# Patient Record
Sex: Male | Born: 1965 | Race: Black or African American | Hispanic: No | Marital: Married | Smoking: Never smoker
Health system: Southern US, Community
[De-identification: ages and names within clinical notes are randomized; demographics above are authoritative.]

## PROBLEM LIST (undated history)

## (undated) DIAGNOSIS — F32A Depression, unspecified: Secondary | ICD-10-CM

## (undated) DIAGNOSIS — E785 Hyperlipidemia, unspecified: Secondary | ICD-10-CM

## (undated) DIAGNOSIS — I639 Cerebral infarction, unspecified: Secondary | ICD-10-CM

## (undated) DIAGNOSIS — G4733 Obstructive sleep apnea (adult) (pediatric): Secondary | ICD-10-CM

## (undated) DIAGNOSIS — I10 Essential (primary) hypertension: Secondary | ICD-10-CM

## (undated) DIAGNOSIS — R519 Headache, unspecified: Secondary | ICD-10-CM

## (undated) DIAGNOSIS — Z8619 Personal history of other infectious and parasitic diseases: Secondary | ICD-10-CM

## (undated) DIAGNOSIS — G8929 Other chronic pain: Secondary | ICD-10-CM

## (undated) DIAGNOSIS — R51 Headache: Secondary | ICD-10-CM

## (undated) DIAGNOSIS — R7989 Other specified abnormal findings of blood chemistry: Secondary | ICD-10-CM

## (undated) DIAGNOSIS — F329 Major depressive disorder, single episode, unspecified: Secondary | ICD-10-CM

## (undated) DIAGNOSIS — J45909 Unspecified asthma, uncomplicated: Secondary | ICD-10-CM

## (undated) HISTORY — PX: ACHILLES TENDON REPAIR: SUR1153

## (undated) HISTORY — DX: Other chronic pain: G89.29

## (undated) HISTORY — DX: Obstructive sleep apnea (adult) (pediatric): G47.33

## (undated) HISTORY — DX: Major depressive disorder, single episode, unspecified: F32.9

## (undated) HISTORY — DX: Other specified abnormal findings of blood chemistry: R79.89

## (undated) HISTORY — DX: Headache: R51

## (undated) HISTORY — DX: Essential (primary) hypertension: I10

## (undated) HISTORY — DX: Cerebral infarction, unspecified: I63.9

## (undated) HISTORY — DX: Personal history of other infectious and parasitic diseases: Z86.19

## (undated) HISTORY — DX: Headache, unspecified: R51.9

## (undated) HISTORY — DX: Depression, unspecified: F32.A

## (undated) HISTORY — DX: Unspecified asthma, uncomplicated: J45.909

## (undated) HISTORY — DX: Hyperlipidemia, unspecified: E78.5

---

## 2010-08-13 DIAGNOSIS — I639 Cerebral infarction, unspecified: Secondary | ICD-10-CM

## 2010-08-13 HISTORY — DX: Cerebral infarction, unspecified: I63.9

## 2015-03-31 ENCOUNTER — Telehealth: Payer: Self-pay | Admitting: Behavioral Health

## 2015-03-31 NOTE — Telephone Encounter (Signed)
Unable to reach patient at time of Pre-Visit Call.  Left message for patient to return call when available.    

## 2015-04-04 ENCOUNTER — Ambulatory Visit (INDEPENDENT_AMBULATORY_CARE_PROVIDER_SITE_OTHER): Payer: BC Managed Care – PPO | Admitting: Physician Assistant

## 2015-04-04 ENCOUNTER — Encounter: Payer: Self-pay | Admitting: Physician Assistant

## 2015-04-04 VITALS — BP 136/96 | HR 62 | Temp 98.2°F | Resp 16 | Ht 72.75 in | Wt 263.1 lb

## 2015-04-04 DIAGNOSIS — F329 Major depressive disorder, single episode, unspecified: Secondary | ICD-10-CM

## 2015-04-04 DIAGNOSIS — R5382 Chronic fatigue, unspecified: Secondary | ICD-10-CM | POA: Diagnosis not present

## 2015-04-04 DIAGNOSIS — Z8673 Personal history of transient ischemic attack (TIA), and cerebral infarction without residual deficits: Secondary | ICD-10-CM

## 2015-04-04 DIAGNOSIS — R7989 Other specified abnormal findings of blood chemistry: Secondary | ICD-10-CM

## 2015-04-04 DIAGNOSIS — F32A Depression, unspecified: Secondary | ICD-10-CM | POA: Insufficient documentation

## 2015-04-04 DIAGNOSIS — E291 Testicular hypofunction: Secondary | ICD-10-CM

## 2015-04-04 DIAGNOSIS — I10 Essential (primary) hypertension: Secondary | ICD-10-CM

## 2015-04-04 LAB — CBC
HEMATOCRIT: 40.8 % (ref 39.0–52.0)
HEMOGLOBIN: 13.6 g/dL (ref 13.0–17.0)
MCHC: 33.3 g/dL (ref 30.0–36.0)
MCV: 80.9 fl (ref 78.0–100.0)
Platelets: 175 10*3/uL (ref 150.0–400.0)
RBC: 5.05 Mil/uL (ref 4.22–5.81)
RDW: 14.8 % (ref 11.5–15.5)
WBC: 7 10*3/uL (ref 4.0–10.5)

## 2015-04-04 LAB — BASIC METABOLIC PANEL
BUN: 16 mg/dL (ref 6–23)
CALCIUM: 9.6 mg/dL (ref 8.4–10.5)
CO2: 26 meq/L (ref 19–32)
CREATININE: 1.45 mg/dL (ref 0.40–1.50)
Chloride: 103 mEq/L (ref 96–112)
GFR: 66.29 mL/min (ref >60.00)
Glucose, Bld: 107 mg/dL — ABNORMAL HIGH (ref 70–99)
Potassium: 4 mEq/L (ref 3.5–5.1)
Sodium: 138 mEq/L (ref 135–145)

## 2015-04-04 LAB — VITAMIN B12: VITAMIN B 12: 535 pg/mL (ref 211–911)

## 2015-04-04 LAB — TSH: TSH: 1.83 u[IU]/mL (ref 0.35–4.50)

## 2015-04-04 MED ORDER — SERTRALINE HCL 50 MG PO TABS
50.0000 mg | ORAL_TABLET | Freq: Every day | ORAL | Status: DC
Start: 2015-04-04 — End: 2015-09-06

## 2015-04-04 MED ORDER — HYDROCHLOROTHIAZIDE 12.5 MG PO TABS: 12.5000 mg | ORAL_TABLET | Freq: Every day | ORAL | Status: DC

## 2015-04-04 NOTE — Assessment & Plan Note (Signed)
In patient with Hx CVA, Depression, Low T and Low Vitamin D. Will check CBC, BMP, TSH, Vitamin D and B12 levels. Patient endorses recent Testosterone level. Will obtain records. Will treat based on findings.

## 2015-04-04 NOTE — Progress Notes (Signed)
Patient presents to clinic today for annual exam.  Patient is fasting for labs.  Patient c/o significant fatigue over the past year with worsening depression, not completely alleviated with Wellbutrin SR 150 mg BID. Has history of low Vitamin D and low testosterone, not currently being treated. Denies SI/HI. Denies anxiety but notes labile mood.  Chronic Issues: Hypertension -- Currently on Lisinopril taking daily. Patient denies chest pain, palpitations, lightheadedness, dizziness, vision changes or frequent headaches. Notes fatigue with medication. Also wanting to switch medications to a friend dying from angioedema 2/2 ACEI therapy. Has hx of CVA but no history of DM, MI or HF.  Hyperlipidemia -- Currently taking Pravachol 40 mg daily without myalgias.   Hx of CVA -- 2012. Is taking BP and cholesterol medications daily. Is taking 325 mg ASA daily. Is not followed by Neurology.  Health Maintenance: Dental -- up-to-date Vision -- up-to-date Immunizations -- up-to-date  Past Medical History  Diagnosis Date  . Stroke 2012  . History of chicken pox     6 Mths old  . Hypertension   . Hyperlipidemia   . Low testosterone   . Asthma     Past Surgical History  Procedure Laterality Date  . Achilles tendon repair      Left Leg    No current outpatient prescriptions on file prior to visit.   No current facility-administered medications on file prior to visit.    No Known Allergies  Family History  Problem Relation Age of Onset  . Hypertension Father   . Stroke Father     Late 18s  . Diabetes Father   . Sinusitis Mother   . Kidney disease Maternal Aunt   . Hypertension Maternal Aunt   . Liver cancer Maternal Aunt   . Hypertension Paternal Grandmother   . Hypertension Maternal Grandfather   . Alzheimer's disease Maternal Grandmother   . Aneurysm Paternal Grandmother   . Heart disease Maternal Grandmother   . Hypertension Paternal Grandfather   . Stroke Paternal  Grandfather   . Healthy Sister   . Healthy Daughter   . Healthy Son     Social History   Social History  . Marital Status: Married    Spouse Name: N/A  . Number of Children: N/A  . Years of Education: N/A   Occupational History  . Not on file.   Social History Main Topics  . Smoking status: Never Smoker   . Smokeless tobacco: Never Used  . Alcohol Use: No     Comment: Former Use  . Drug Use: No  . Sexual Activity: Not Currently   Other Topics Concern  . Not on file   Social History Narrative   Review of Systems  Constitutional: Positive for malaise/fatigue. Negative for fever and weight loss.  Eyes: Negative for blurred vision.  Cardiovascular: Negative for chest pain and palpitations.  Neurological: Negative for dizziness, tingling, tremors, sensory change, loss of consciousness and headaches.  Psychiatric/Behavioral: Positive for depression. Negative for suicidal ideas, hallucinations and substance abuse. The patient is not nervous/anxious and does not have insomnia.     BP 136/96 mmHg  Pulse 62  Temp(Src) 98.2 F (36.8 C) (Oral)  Resp 16  Ht 6' 0.75" (1.848 m)  Wt 263 lb 2 oz (119.353 kg)  BMI 34.95 kg/m2  SpO2 99%  Physical Exam  Constitutional: He is oriented to person, place, and time and well-developed, well-nourished, and in no distress.  HENT:  Head: Normocephalic and atraumatic.  Eyes: Conjunctivae  are normal.  Neck: Neck supple.  Cardiovascular: Normal rate, regular rhythm, normal heart sounds and intact distal pulses.   Pulmonary/Chest: Effort normal and breath sounds normal. No respiratory distress. He has no wheezes. He has no rales. He exhibits no tenderness.  Neurological: He is alert and oriented to person, place, and time.  Skin: Skin is warm and dry. No rash noted.  Psychiatric: He exhibits a depressed mood.  Vitals reviewed.   No results found for this or any previous visit (from the past 2160 hour(s)).  Assessment/Plan: Chronic  fatigue In patient with Hx CVA, Depression, Low T and Low Vitamin D. Will check CBC, BMP, TSH, Vitamin D and B12 levels. Patient endorses recent Testosterone level. Will obtain records. Will treat based on findings.  Depression PHQ 15 currently on treatment with Wellbutrin SR 150 mg BID. Will add-on Sertraline 50 mg daily. Counseling services discussed. Handout given to schedule an appointment. Side effects and alarm symptoms discussed. Follow-up 1 month.  Essential hypertension, benign Will stop lisinopril and begin HCTZ 12.5 mg daily. DASH diet discussed. Weight loss encouraged. Continue ASA 325 mg daily. Follow-up 1 month.  Low serum vitamin D Will check Vitamin D level.  Low testosterone Will obtain records so testosterone levels can be assessed.

## 2015-04-04 NOTE — Progress Notes (Signed)
Pre visit review using our clinic review tool, if applicable. No additional management support is needed unless otherwise documented below in the visit note/SLS  

## 2015-04-04 NOTE — Patient Instructions (Signed)
Please start the Zoloft taking daily as directed. Continue the Wellbutrin.  For blood pressure, stop the lisinopril and begin the HCTZ daily. Watch your salt intake!  I will call you with your lab results.  Follow-up 1 month  DASH Eating Plan DASH stands for "Dietary Approaches to Stop Hypertension." The DASH eating plan is a healthy eating plan that has been shown to reduce high blood pressure (hypertension). Additional health benefits may include reducing the risk of type 2 diabetes mellitus, heart disease, and stroke. The DASH eating plan may also help with weight loss. WHAT DO I NEED TO KNOW ABOUT THE DASH EATING PLAN? For the DASH eating plan, you will follow these general guidelines:  Choose foods with a percent daily value for sodium of less than 5% (as listed on the food label).  Use salt-free seasonings or herbs instead of table salt or sea salt.  Check with your health care provider or pharmacist before using salt substitutes.  Eat lower-sodium products, often labeled as "lower sodium" or "no salt added."  Eat fresh foods.  Eat more vegetables, fruits, and low-fat dairy products.  Choose whole grains. Look for the word "whole" as the first word in the ingredient list.  Choose fish and skinless chicken or [place] more often than red meat. Limit fish, poultry, and meat to 6 oz (170 g) each day.  Limit sweets, desserts, sugars, and sugary drinks.  Choose heart-healthy fats.  Limit cheese to 1 oz (28 g) per day.  Eat more home-cooked food and less restaurant, buffet, and fast food.  Limit fried foods.  Cook foods using methods other than frying.  Limit canned vegetables. If you do use them, rinse them well to decrease the sodium.  When eating at a restaurant, ask that your food be prepared with less salt, or no salt if possible. WHAT FOODS CAN I EAT? Seek help from a dietitian for individual calorie needs. Grains Whole grain or whole wheat bread. Brown rice.  Whole grain or whole wheat pasta. Quinoa, bulgur, and whole grain cereals. Low-sodium cereals. Corn or whole wheat flour tortillas. Whole grain cornbread. Whole grain crackers. Low-sodium crackers. Vegetables Fresh or frozen vegetables (raw, steamed, roasted, or grilled). Low-sodium or reduced-sodium tomato and vegetable juices. Low-sodium or reduced-sodium tomato sauce and paste. Low-sodium or reduced-sodium canned vegetables.  Fruits All fresh, canned (in natural juice), or frozen fruits. Meat and Other Protein Products Ground beef (85% or leaner), grass-fed beef, or beef trimmed of fat. Skinless chicken or [place]. Ground chicken or [place]. Pork trimmed of fat. All fish and seafood. Eggs. Dried beans, peas, or lentils. Unsalted nuts and seeds. Unsalted canned beans. Dairy Low-fat dairy products, such as skim or 1% milk, 2% or reduced-fat cheeses, low-fat ricotta or cottage cheese, or plain low-fat yogurt. Low-sodium or reduced-sodium cheeses. Fats and Oils Tub margarines without trans fats. Light or reduced-fat mayonnaise and salad dressings (reduced sodium). Avocado. Safflower, olive, or canola oils. Natural peanut or almond butter. Other Unsalted popcorn and pretzels. The items listed above may not be a complete list of recommended foods or beverages. Contact your dietitian for more options. WHAT FOODS ARE NOT RECOMMENDED? Grains White bread. White pasta. White rice. Refined cornbread. Bagels and croissants. Crackers that contain trans fat. Vegetables Creamed or fried vegetables. Vegetables in a cheese sauce. Regular canned vegetables. Regular canned tomato sauce and paste. Regular tomato and vegetable juices. Fruits Dried fruits. Canned fruit in light or heavy syrup. Fruit juice. Meat and Other Protein Products Fatty cuts  of meat. Ribs, chicken wings, bacon, sausage, bologna, salami, chitterlings, fatback, hot dogs, bratwurst, and packaged luncheon meats. Salted nuts and seeds. Canned  beans with salt. Dairy Whole or 2% milk, cream, half-and-half, and cream cheese. Whole-fat or sweetened yogurt. Full-fat cheeses or blue cheese. Nondairy creamers and whipped toppings. Processed cheese, cheese spreads, or cheese curds. Condiments Onion and garlic salt, seasoned salt, table salt, and sea salt. Canned and packaged gravies. Worcestershire sauce. Tartar sauce. Barbecue sauce. Teriyaki sauce. Soy sauce, including reduced sodium. Steak sauce. Fish sauce. Oyster sauce. Cocktail sauce. Horseradish. Ketchup and mustard. Meat flavorings and tenderizers. Bouillon cubes. Hot sauce. Tabasco sauce. Marinades. Taco seasonings. Relishes. Fats and Oils Butter, stick margarine, lard, shortening, ghee, and bacon fat. Coconut, palm kernel, or palm oils. Regular salad dressings. Other Pickles and olives. Salted popcorn and pretzels. The items listed above may not be a complete list of foods and beverages to avoid. Contact your dietitian for more information. WHERE CAN I FIND MORE INFORMATION? National Heart, Lung, and Blood Institute: [employer] Document Released: 07/19/2011 Document Revised: 12/14/2013 Document Reviewed: 06/03/2013 [facility] Patient Information 2015 [place], [place]. This information is not intended to replace advice given to you by your health care provider. Make sure you discuss any questions you have with your health care provider.

## 2015-04-04 NOTE — Assessment & Plan Note (Signed)
Will check Vitamin D level

## 2015-04-04 NOTE — Assessment & Plan Note (Signed)
PHQ 15 currently on treatment with Wellbutrin SR 150 mg BID. Will add-on Sertraline 50 mg daily. Counseling services discussed. Handout given to schedule an appointment. Side effects and alarm symptoms discussed. Follow-up 1 month.

## 2015-04-04 NOTE — Assessment & Plan Note (Signed)
Will obtain records so testosterone levels can be assessed.

## 2015-04-04 NOTE — Assessment & Plan Note (Signed)
Will stop lisinopril and begin HCTZ 12.5 mg daily. DASH diet discussed. Weight loss encouraged. Continue ASA 325 mg daily. Follow-up 1 month.

## 2015-04-07 LAB — VITAMIN D 1,25 DIHYDROXY
Vitamin D 1, 25 (OH)2 Total: 60 pg/mL (ref 18–72)
Vitamin D2 1, 25 (OH)2: 19 pg/mL
Vitamin D3 1, 25 (OH)2: 41 pg/mL

## 2015-04-08 ENCOUNTER — Other Ambulatory Visit: Payer: Self-pay | Admitting: Physician Assistant

## 2015-04-08 ENCOUNTER — Telehealth: Payer: Self-pay | Admitting: Physician Assistant

## 2015-04-08 NOTE — Telephone Encounter (Signed)
Pt returned your call. Please call back.    CB: [phone]

## 2015-04-08 NOTE — Telephone Encounter (Signed)
Please call to assess this further.  

## 2015-04-08 NOTE — Telephone Encounter (Signed)
Relation to pt: self Call back number: [phone]  Pharmacy:  Reason for call:  Patient would like to discuss his heart rate due to patient considering doing a work out program.

## 2015-04-08 NOTE — Telephone Encounter (Signed)
LMOM with contact name and number for return call RE: needing further information [what type of program; what is heart rate and if there are any accompanying symptoms: dizziness, h/a, chest pain, SOB] per provider instructions/SLS

## 2015-04-11 NOTE — Telephone Encounter (Signed)
Patient reports he is startoing work out program at [employer] and had a episode of feeling "a little dizzy once" and they are requiring to know which medications make patient dizzy [he states he has this information] and thay are requiring to know by provider what is the maximum heart rate allowed during exercise/SLS Please Advise.

## 2015-04-11 NOTE — Telephone Encounter (Signed)
Hydrochlorothiazide may make him dizzy if BP getting low. We just started medication Recommend he stay well hydrated. I would recommend he come in for follow-up of BP on new med and EKG before deciding on a strenuous exercise regimen. I cannot give a max heart rate without making sure baseline EKG is stable

## 2015-04-12 NOTE — Telephone Encounter (Signed)
LMOM with contact name and number [Ok by patient [phone]] for return call RE: schedule appointment for BP check & EKG prior to starting strenuous exercise regimenand per further provider instructions/SLS

## 2015-04-13 ENCOUNTER — Ambulatory Visit (INDEPENDENT_AMBULATORY_CARE_PROVIDER_SITE_OTHER): Payer: BC Managed Care – PPO | Admitting: Psychology

## 2015-04-13 DIAGNOSIS — F4323 Adjustment disorder with mixed anxiety and depressed mood: Secondary | ICD-10-CM

## 2015-04-19 NOTE — Telephone Encounter (Signed)
Pt returning your call. Tried calling you to confirm appt type and pt hung up. Please let him know if he needs nurse visit or appt with [facility].

## 2015-04-19 NOTE — Telephone Encounter (Signed)
Patient scheduled follow up with [facility] for 05/02/2015. Is this ok patient states he wants EKG and BP done all at once.

## 2015-05-02 ENCOUNTER — Ambulatory Visit (INDEPENDENT_AMBULATORY_CARE_PROVIDER_SITE_OTHER): Payer: BC Managed Care – PPO | Admitting: Physician Assistant

## 2015-05-02 ENCOUNTER — Encounter: Payer: Self-pay | Admitting: Physician Assistant

## 2015-05-02 VITALS — BP 128/84 | HR 72 | Temp 98.1°F | Resp 16 | Ht 73.0 in | Wt 256.1 lb

## 2015-05-02 DIAGNOSIS — I10 Essential (primary) hypertension: Secondary | ICD-10-CM | POA: Diagnosis not present

## 2015-05-02 DIAGNOSIS — F329 Major depressive disorder, single episode, unspecified: Secondary | ICD-10-CM | POA: Diagnosis not present

## 2015-05-02 DIAGNOSIS — F32A Depression, unspecified: Secondary | ICD-10-CM

## 2015-05-02 MED ORDER — BUPROPION HCL ER (SR) 150 MG PO TB12: 150.0000 mg | ORAL_TABLET | Freq: Two times a day (BID) | ORAL | Status: DC

## 2015-05-02 NOTE — Progress Notes (Signed)
Patient presents to clinic today for follow-up of hypertension after starting HCTZ 12.5 mg daily. Endorses taking medication daily as directed. Patient denies chest pain, palpitations, lightheadedness, dizziness, vision changes or frequent headaches.  BP Readings from Last 3 Encounters:  05/02/15 128/84  04/04/15 136/96   Patient also endorses that depressed mood persistent despite increase in Zoloft. Patient reported Wellbutrin 150 mg BID at last visit but states he stopped medication by mistake. Denies withdrawal symptoms from medication. Denies SI/HI.  Past Medical History  Diagnosis Date  . Stroke 2012  . History of chicken pox     66 Mths old  . Hypertension   . Hyperlipidemia   . Low testosterone   . Asthma     Current Outpatient Prescriptions on File Prior to Visit  Medication Sig Dispense Refill  . aspirin 325 MG EC tablet Take 325 mg by mouth daily.    . fexofenadine (ALLEGRA) 180 MG tablet Take 180 mg by mouth daily as needed for allergies or rhinitis.    . hydrochlorothiazide (HYDRODIURIL) 12.5 MG tablet Take 1 tablet (12.5 mg total) by mouth daily. 30 tablet 3  . pravastatin (PRAVACHOL) 40 MG tablet TAKE 1 TABLET BY MOUTH DAILY 30 tablet 5  . sertraline (ZOLOFT) 50 MG tablet Take 1 tablet (50 mg total) by mouth daily. 30 tablet 3   No current facility-administered medications on file prior to visit.    No Known Allergies  Family History  Problem Relation Age of Onset  . Hypertension Father   . Stroke Father     Late 3s  . Diabetes Father   . Sinusitis Mother   . Kidney disease Maternal Aunt   . Hypertension Maternal Aunt   . Liver cancer Maternal Aunt   . Hypertension Paternal Grandmother   . Hypertension Maternal Grandfather   . Alzheimer's disease Maternal Grandmother   . Aneurysm Paternal Grandmother   . Heart disease Maternal Grandmother   . Hypertension Paternal Grandfather   . Stroke Paternal Grandfather   . Healthy Sister   . Healthy Daughter    . Healthy Son     Social History   Social History  . Marital Status: Married    Spouse Name: N/A  . Number of Children: N/A  . Years of Education: N/A   Social History Main Topics  . Smoking status: Never Smoker   . Smokeless tobacco: Never Used  . Alcohol Use: No     Comment: Former Use  . Drug Use: No  . Sexual Activity: Not Currently   Other Topics Concern  . None   Social History Narrative   Review of Systems - See HPI.  All other ROS are negative.  BP 128/84 mmHg  Pulse 72  Temp(Src) 98.1 F (36.7 C) (Oral)  Resp 16  Ht  (1.854 m)  Wt 256 lb 2 oz (116.178 kg)  BMI 33.80 kg/m2  SpO2 98%  Physical Exam  Constitutional: He is oriented to person, place, and time and well-developed, well-nourished, and in no distress.  HENT:  Head: Normocephalic and atraumatic.  Right Ear: External ear normal.  Left Ear: External ear normal.  Nose: Nose normal.  Mouth/Throat: Oropharynx is clear and moist. No oropharyngeal exudate.  Eyes: EOM are normal.  Cardiovascular: Normal rate, regular rhythm, normal heart sounds and intact distal pulses.   Pulmonary/Chest: Effort normal and breath sounds normal. No respiratory distress. He has no wheezes. He has no rales. He exhibits no tenderness.  Abdominal: Soft. Bowel sounds  are normal. He exhibits no distension and no mass. There is no tenderness. There is no rebound and no guarding.  Genitourinary: Testes/scrotum normal and penis normal. No discharge found.  Neurological: He is alert and oriented to person, place, and time.  Skin: Skin is warm and dry. No rash noted.  Psychiatric: Affect normal.  Vitals reviewed.  Recent Results (from the past 2160 hour(s))  CBC     Status: None   Collection Time: 04/04/15  9:18 AM  Result Value Ref Range   WBC 7.0 4.0 - 10.5 K/uL   RBC 5.05 4.22 - 5.81 Mil/uL   Platelets 175.0 150.0 - 400.0 K/uL   Hemoglobin 13.6 13.0 - 17.0 g/dL   HCT 16.1 09.6 - 04.5 %   MCV 80.9 78.0 - 100.0 fl    MCHC 33.3 30.0 - 36.0 g/dL   RDW 40.9 81.1 - 91.4 %  Basic Metabolic Panel (BMET)     Status: Abnormal   Collection Time: 04/04/15  9:18 AM  Result Value Ref Range   Sodium 138 135 - 145 mEq/L   Potassium 4.0 3.5 - 5.1 mEq/L   Chloride 103 96 - 112 mEq/L   CO2 26 19 - 32 mEq/L   Glucose, Bld 107 (H) 70 - 99 mg/dL   BUN 16 6 - 23 mg/dL   Creatinine, Ser 7.82 0.40 - 1.50 mg/dL   Calcium 9.6 8.4 - 95.6 mg/dL   GFR 21.30 >86.57 mL/min  Vitamin D 1,25 dihydroxy     Status: None   Collection Time: 04/04/15  9:18 AM  Result Value Ref Range   Vitamin D 1, 25 (OH)2 Total 60 18 - 72 pg/mL   Vitamin D3 1, 25 (OH)2 41 pg/mL   Vitamin D2 1, 25 (OH)2 19 pg/mL    Comment: Vitamin D3, 1,25(OH)2 indicates both endogenous production and supplementation.  Vitamin D2, 1,25(OH)2 is an indicator of exogeous sources, such as diet or supplementation.  Interpretation and therapy are based on measurement of Vitamin D,1,25(OH)2, Total. This test was developed and its analytical performance characteristics have been determined by [facility], [place], [place]. It has not been cleared or approved by the FDA. This assay has been validated pursuant to the CLIA regulations and is used for clinical purposes.   TSH     Status: None   Collection Time: 04/04/15  9:18 AM  Result Value Ref Range   TSH 1.83 0.35 - 4.50 uIU/mL  B12     Status: None   Collection Time: 04/04/15  9:18 AM  Result Value Ref Range   Vitamin B-12 535 211 - 911 pg/mL    Assessment/Plan: Essential hypertension EKG with NSR. Exercise regimen discussed. BP improved. Continue current regimen. Follow-up 3 months.  Depression Continue Sertraline. Resume Wellbutrin SR BID as directed. Follow-up in 3 months if symptoms have improved. Return sooner as needed.

## 2015-05-02 NOTE — Assessment & Plan Note (Signed)
EKG with NSR. Exercise regimen discussed. BP improved. Continue current regimen. Follow-up 3 months.

## 2015-05-02 NOTE — Assessment & Plan Note (Signed)
Continue Sertraline. Resume Wellbutrin SR BID as directed. Follow-up in 3 months if symptoms have improved. Return sooner as needed.

## 2015-05-02 NOTE — Patient Instructions (Signed)
Please continue your BP medications. Gradually increase your aerobic exercise to a goal of 150 minutes per week. Your target HR is 185 when you are working out on the treadmill. Remember to stay well hydrated.  Please continue the Zoloft but restart your Wellbutrin as directed. Start a B Complex vitamin daily.  Follow-up 3 months.

## 2015-05-02 NOTE — Progress Notes (Signed)
Pre visit review using our clinic review tool, if applicable. No additional management support is needed unless otherwise documented below in the visit note/SLS  

## 2015-05-04 ENCOUNTER — Ambulatory Visit (INDEPENDENT_AMBULATORY_CARE_PROVIDER_SITE_OTHER): Payer: BC Managed Care – PPO | Admitting: Psychology

## 2015-05-04 DIAGNOSIS — F4323 Adjustment disorder with mixed anxiety and depressed mood: Secondary | ICD-10-CM

## 2015-06-01 ENCOUNTER — Ambulatory Visit: Payer: BC Managed Care – PPO | Admitting: Psychology

## 2015-09-06 ENCOUNTER — Other Ambulatory Visit: Payer: Self-pay | Admitting: Physician Assistant

## 2015-09-06 DIAGNOSIS — I10 Essential (primary) hypertension: Secondary | ICD-10-CM

## 2015-09-06 DIAGNOSIS — F32A Depression, unspecified: Secondary | ICD-10-CM

## 2015-09-06 DIAGNOSIS — F329 Major depressive disorder, single episode, unspecified: Secondary | ICD-10-CM

## 2015-09-06 MED ORDER — PRAVASTATIN SODIUM 40 MG PO TABS: 40.0000 mg | ORAL_TABLET | Freq: Every day | ORAL | Status: DC

## 2015-09-06 MED ORDER — SERTRALINE HCL 50 MG PO TABS: 50.0000 mg | ORAL_TABLET | Freq: Every day | ORAL | Status: DC

## 2015-09-06 MED ORDER — BUPROPION HCL ER (SR) 150 MG PO TB12: 150.0000 mg | ORAL_TABLET | Freq: Two times a day (BID) | ORAL | Status: DC

## 2015-09-06 MED ORDER — HYDROCHLOROTHIAZIDE 12.5 MG PO TABS
12.5000 mg | ORAL_TABLET | Freq: Every day | ORAL | Status: DC
Start: 2015-09-06 — End: 2015-11-16

## 2015-09-06 NOTE — Telephone Encounter (Signed)
Caller name: Self  Can be reached: [phone] Pharmacy:   [email or web address] [place], [place] - [phone] [phone] (Phone) [phone] (Fax)        Reason for call: Refill on sertraline (ZOLOFT) 50 MG tablet [[id number]] and pravastatin (PRAVACHOL) 40 MG tablet [[id number]] and hydrochlorothiazide (HYDRODIURIL) 12.5 MG tablet [[id number] and buPROPion (WELLBUTRIN SR) 150 MG 12 hr tablet [[id number]]

## 2015-09-06 NOTE — Telephone Encounter (Signed)
Refills sent

## 2015-11-16 ENCOUNTER — Encounter: Payer: Self-pay | Admitting: Physician Assistant

## 2015-11-16 ENCOUNTER — Ambulatory Visit (INDEPENDENT_AMBULATORY_CARE_PROVIDER_SITE_OTHER): Payer: Medicaid Other | Admitting: Physician Assistant

## 2015-11-16 VITALS — BP 170/100 | HR 68 | Temp 98.5°F | Ht 73.0 in | Wt 255.2 lb

## 2015-11-16 DIAGNOSIS — I10 Essential (primary) hypertension: Secondary | ICD-10-CM

## 2015-11-16 MED ORDER — HYDROCHLOROTHIAZIDE 25 MG PO TABS: 25.0000 mg | ORAL_TABLET | Freq: Every day | ORAL | Status: DC

## 2015-11-16 NOTE — Assessment & Plan Note (Signed)
Repeat BP still elevated. Asymptomatic. Discussed importance of DASH diet. Will increase HCTZ to 25 mg daily. He is changing PCP due to Medicaid. He is to follow-up with new provider in 2-4 weeks.

## 2015-11-16 NOTE — Patient Instructions (Signed)
Please start the new dose of Hydrochlorothiazide daily.  Increase fluids.  Follow-up with your new provider in a few weeks.  If insurance changes, I am more than happy to see you again!

## 2015-11-16 NOTE — Progress Notes (Signed)
Patient presents to clinic today for follow-up of hypertension.  Patient endorses taking hydrchlorothiazide 12.5 mg daily as directed. Patient denies chest pain, palpitations, lightheadedness, dizziness, vision changes or frequent headaches.  BP Readings from Last 3 Encounters:  11/16/15 170/100  05/02/15 128/84  04/04/15 136/96   Past Medical History  Diagnosis Date  . Stroke (HCC) 2012  . History of chicken pox     5517 Mths old  . Hypertension   . Hyperlipidemia   . Low testosterone   . Asthma     Current Outpatient Prescriptions on File Prior to Visit  Medication Sig Dispense Refill  . aspirin 325 MG EC tablet Take 325 mg by mouth daily.    Marland Kitchen. buPROPion (WELLBUTRIN SR) 150 MG 12 hr tablet Take 1 tablet (150 mg total) by mouth 2 (two) times daily. 180 tablet 1  . hydrochlorothiazide (HYDRODIURIL) 12.5 MG tablet Take 1 tablet (12.5 mg total) by mouth daily. 90 tablet 1  . pravastatin (PRAVACHOL) 40 MG tablet Take 1 tablet (40 mg total) by mouth daily. 90 tablet 0  . sertraline (ZOLOFT) 50 MG tablet Take 1 tablet (50 mg total) by mouth daily. 90 tablet 1  . fexofenadine (ALLEGRA) 180 MG tablet Take 180 mg by mouth daily as needed for allergies or rhinitis. Reported on 11/16/2015     No current facility-administered medications on file prior to visit.    No Known Allergies  Family History  Problem Relation Age of Onset  . Hypertension Father   . Stroke Father     Late 7750s  . Diabetes Father   . Sinusitis Mother   . Kidney disease Maternal Aunt   . Hypertension Maternal Aunt   . Liver cancer Maternal Aunt   . Hypertension Paternal Grandmother   . Hypertension Maternal Grandfather   . Alzheimer's disease Maternal Grandmother   . Aneurysm Paternal Grandmother   . Heart disease Maternal Grandmother   . Hypertension Paternal Grandfather   . Stroke Paternal Grandfather   . Healthy Sister   . Healthy Daughter   . Healthy Son     Social History   Social History  .  Marital Status: Married    Spouse Name: N/A  . Number of Children: N/A  . Years of Education: N/A   Social History Main Topics  . Smoking status: Never Smoker   . Smokeless tobacco: Never Used  . Alcohol Use: No     Comment: Former Use  . Drug Use: No  . Sexual Activity: Not Currently   Other Topics Concern  . None   Social History Narrative   Review of Systems - See HPI.  All other ROS are negative.  BP 176/100 mmHg  Pulse 79  Temp(Src) 98.5 F (36.9 C) (Oral)  Ht 6\' 1"  (1.854 m)  Wt 255 lb 3.2 oz (115.758 kg)  BMI 33.68 kg/m2  SpO2 98%  Physical Exam  Constitutional: He is oriented to person, place, and time and well-developed, well-nourished, and in no distress.  HENT:  Head: Normocephalic and atraumatic.  Eyes: Conjunctivae are normal.  Cardiovascular: Normal rate, regular rhythm, normal heart sounds and intact distal pulses.   Pulmonary/Chest: Effort normal and breath sounds normal. No respiratory distress. He has no wheezes. He has no rales. He exhibits no tenderness.  Neurological: He is alert and oriented to person, place, and time.  Skin: Skin is warm and dry. No rash noted.  Psychiatric: Affect normal.  Vitals reviewed.    Assessment/Plan: No problem-specific assessment &  plan notes found for this encounter.

## 2015-11-16 NOTE — Progress Notes (Signed)
Pre visit review using our clinic review tool, if applicable. No additional management support is needed unless otherwise documented below in the visit note. 

## 2016-01-05 ENCOUNTER — Other Ambulatory Visit: Payer: Self-pay | Admitting: Physician Assistant

## 2016-01-16 ENCOUNTER — Telehealth: Payer: Self-pay | Admitting: Physician Assistant

## 2016-01-16 MED ORDER — PRAVASTATIN SODIUM 40 MG PO TABS: 40.0000 mg | ORAL_TABLET | Freq: Every day | ORAL | Status: DC

## 2016-01-16 NOTE — Telephone Encounter (Signed)
Rx sent to the pharmacy by e-script.  Pt due complete physical and fasting labs.//AB/CMA

## 2016-01-16 NOTE — Telephone Encounter (Signed)
Caller name: Self  Can be reached: [phone]   Pharmacy:  [id number] - [place], [place] - [address] [facility] [address] [phone] (Phone) [phone] (Fax)         Reason for call: refill pravastatin (PRAVACHOL) 40 MG tablet

## 2016-01-18 ENCOUNTER — Telehealth: Payer: Self-pay | Admitting: Physician Assistant

## 2016-01-18 ENCOUNTER — Ambulatory Visit: Payer: Medicaid Other | Admitting: Physician Assistant

## 2016-01-18 NOTE — Telephone Encounter (Signed)
VM from pt 6/7 9:35 am to cancel appt for today 3:30pm, called pt home # and he said he doesn't have a ride, pt scheduled for cpe 6/9, charge or no charge?

## 2016-01-18 NOTE — Telephone Encounter (Signed)
No charge for 1st no-show 

## 2016-01-18 NOTE — Telephone Encounter (Signed)
Scheduled pt for CPE  01/20/16

## 2016-01-19 ENCOUNTER — Encounter: Payer: Self-pay | Admitting: Physician Assistant

## 2016-01-19 NOTE — Telephone Encounter (Signed)
Waiving fee, mailing reminder letter °

## 2016-01-20 ENCOUNTER — Encounter: Payer: Self-pay | Admitting: Physician Assistant

## 2016-01-20 ENCOUNTER — Ambulatory Visit (INDEPENDENT_AMBULATORY_CARE_PROVIDER_SITE_OTHER): Payer: PRIVATE HEALTH INSURANCE | Admitting: Physician Assistant

## 2016-01-20 ENCOUNTER — Ambulatory Visit: Payer: Self-pay | Admitting: Physician Assistant

## 2016-01-20 VITALS — BP 167/109 | HR 69 | Temp 98.2°F | Resp 16 | Ht 73.0 in | Wt 256.2 lb

## 2016-01-20 DIAGNOSIS — I10 Essential (primary) hypertension: Secondary | ICD-10-CM | POA: Diagnosis not present

## 2016-01-20 DIAGNOSIS — G4710 Hypersomnia, unspecified: Secondary | ICD-10-CM

## 2016-01-20 DIAGNOSIS — R400 Somnolence: Secondary | ICD-10-CM | POA: Insufficient documentation

## 2016-01-20 DIAGNOSIS — Z0001 Encounter for general adult medical examination with abnormal findings: Secondary | ICD-10-CM | POA: Diagnosis not present

## 2016-01-20 DIAGNOSIS — R7989 Other specified abnormal findings of blood chemistry: Secondary | ICD-10-CM

## 2016-01-20 DIAGNOSIS — Z125 Encounter for screening for malignant neoplasm of prostate: Secondary | ICD-10-CM | POA: Insufficient documentation

## 2016-01-20 DIAGNOSIS — Z0000 Encounter for general adult medical examination without abnormal findings: Secondary | ICD-10-CM | POA: Diagnosis not present

## 2016-01-20 DIAGNOSIS — E291 Testicular hypofunction: Secondary | ICD-10-CM

## 2016-01-20 MED ORDER — LISINOPRIL 10 MG PO TABS: 10.0000 mg | ORAL_TABLET | Freq: Every day | ORAL | Status: DC

## 2016-01-20 MED ORDER — PRAVASTATIN SODIUM 40 MG PO TABS: 40.0000 mg | ORAL_TABLET | Freq: Every day | ORAL | Status: DC

## 2016-01-20 NOTE — Assessment & Plan Note (Signed)
Uncontrolled. Suspect sleep apnea a major component. Referral to Sleep Medicine placed. Diet and exercise encouraged to promote weight loss. DASH diet recommended. Will continue HCTZ. Will add-on lisinopril 10 mg daily. Will check metabolic panel. FU 1 month.

## 2016-01-20 NOTE — Assessment & Plan Note (Signed)
With snoring and apneic episodes. History of abnormal sleep study in the far past. Referral to Pulmonary/Sleep Medicine placed for sleep study. Likely will need CPAP. Discussed weight loss to help with this. His goal is 150 minutes of cardio per week.

## 2016-01-20 NOTE — Assessment & Plan Note (Signed)
The natural history of prostate cancer and ongoing controversy regarding screening and potential treatment outcomes of prostate cancer has been discussed with the patient. The meaning of a false positive PSA and a false negative PSA has been discussed. He indicates understanding of the limitations of this screening test and wishes  to proceed with screening PSA testing.  

## 2016-01-20 NOTE — Assessment & Plan Note (Signed)
Depression screen negative. Health Maintenance reviewed -- immunizations up-to-date. Declines Colonoscopy. Discussed Cologuard. He will check with insurance for coverage. Will order if he wishes. Preventive schedule discussed and handout given in AVS. Will obtain fasting labs today.

## 2016-01-20 NOTE — Patient Instructions (Signed)
Please schedule a lab appointment to get fasting blood work. I will call you with your results.  Please continue your chronic medications with the following exceptions:  - Take the hydrochlorothiazide in the mornings.  - Start the lisinopril daily.   Follow the diet below. We will alter your regimen based on results.  Please check with insurance regarding coverage for Cologuard. You will be contacted for an appointment with Sleep Medicine.  DASH Eating Plan DASH stands for "Dietary Approaches to Stop Hypertension." The DASH eating plan is a healthy eating plan that has been shown to reduce high blood pressure (hypertension). Additional health benefits may include reducing the risk of type 2 diabetes mellitus, heart disease, and stroke. The DASH eating plan may also help with weight loss. WHAT DO I NEED TO KNOW ABOUT THE DASH EATING PLAN? For the DASH eating plan, you will follow these general guidelines:  Choose foods with a percent daily value for sodium of less than 5% (as listed on the food label).  Use salt-free seasonings or herbs instead of table salt or sea salt.  Check with your health care provider or pharmacist before using salt substitutes.  Eat lower-sodium products, often labeled as "lower sodium" or "no salt added."  Eat fresh foods.  Eat more vegetables, fruits, and low-fat dairy products.  Choose whole grains. Look for the word "whole" as the first word in the ingredient list.  Choose fish and skinless chicken or [place] more often than red meat. Limit fish, poultry, and meat to 6 oz (170 g) each day.  Limit sweets, desserts, sugars, and sugary drinks.  Choose heart-healthy fats.  Limit cheese to 1 oz (28 g) per day.  Eat more home-cooked food and less restaurant, buffet, and fast food.  Limit fried foods.  Cook foods using methods other than frying.  Limit canned vegetables. If you do use them, rinse them well to decrease the sodium.  When eating at a  restaurant, ask that your food be prepared with less salt, or no salt if possible. WHAT FOODS CAN I EAT? Seek help from a dietitian for individual calorie needs. Grains Whole grain or whole wheat bread. Brown rice. Whole grain or whole wheat pasta. Quinoa, bulgur, and whole grain cereals. Low-sodium cereals. Corn or whole wheat flour tortillas. Whole grain cornbread. Whole grain crackers. Low-sodium crackers. Vegetables Fresh or frozen vegetables (raw, steamed, roasted, or grilled). Low-sodium or reduced-sodium tomato and vegetable juices. Low-sodium or reduced-sodium tomato sauce and paste. Low-sodium or reduced-sodium canned vegetables.  Fruits All fresh, canned (in natural juice), or frozen fruits. Meat and Other Protein Products Ground beef (85% or leaner), grass-fed beef, or beef trimmed of fat. Skinless chicken or [place]. Ground chicken or [place]. Pork trimmed of fat. All fish and seafood. Eggs. Dried beans, peas, or lentils. Unsalted nuts and seeds. Unsalted canned beans. Dairy Low-fat dairy products, such as skim or 1% milk, 2% or reduced-fat cheeses, low-fat ricotta or cottage cheese, or plain low-fat yogurt. Low-sodium or reduced-sodium cheeses. Fats and Oils Tub margarines without trans fats. Light or reduced-fat mayonnaise and salad dressings (reduced sodium). Avocado. Safflower, olive, or canola oils. Natural peanut or almond butter. Other Unsalted popcorn and pretzels. The items listed above may not be a complete list of recommended foods or beverages. Contact your dietitian for more options. WHAT FOODS ARE NOT RECOMMENDED? Grains White bread. White pasta. White rice. Refined cornbread. Bagels and croissants. Crackers that contain trans fat. Vegetables Creamed or fried vegetables. Vegetables in a cheese  sauce. Regular canned vegetables. Regular canned tomato sauce and paste. Regular tomato and vegetable juices. Fruits Dried fruits. Canned fruit in light or heavy syrup. Fruit  juice. Meat and Other Protein Products Fatty cuts of meat. Ribs, chicken wings, bacon, sausage, bologna, salami, chitterlings, fatback, hot dogs, bratwurst, and packaged luncheon meats. Salted nuts and seeds. Canned beans with salt. Dairy Whole or 2% milk, cream, half-and-half, and cream cheese. Whole-fat or sweetened yogurt. Full-fat cheeses or blue cheese. Nondairy creamers and whipped toppings. Processed cheese, cheese spreads, or cheese curds. Condiments Onion and garlic salt, seasoned salt, table salt, and sea salt. Canned and packaged gravies. Worcestershire sauce. Tartar sauce. Barbecue sauce. Teriyaki sauce. Soy sauce, including reduced sodium. Steak sauce. Fish sauce. Oyster sauce. Cocktail sauce. Horseradish. Ketchup and mustard. Meat flavorings and tenderizers. Bouillon cubes. Hot sauce. Tabasco sauce. Marinades. Taco seasonings. Relishes. Fats and Oils Butter, stick margarine, lard, shortening, ghee, and bacon fat. Coconut, palm kernel, or palm oils. Regular salad dressings. Other Pickles and olives. Salted popcorn and pretzels. The items listed above may not be a complete list of foods and beverages to avoid. Contact your dietitian for more information. WHERE CAN I FIND MORE INFORMATION? National Heart, Lung, and Blood Institute: travelstabloid.com   This information is not intended to replace advice given to you by your health care provider. Make sure you discuss any questions you have with your health care provider.   Document Released: 07/19/2011 Document Revised: 08/20/2014 Document Reviewed: 06/03/2013 Elsevier Interactive Patient Education 2016 [place] for Adults, Male A healthy lifestyle and preventive care can promote health and wellness. Preventive health guidelines for men include the following key practices:  A routine yearly physical is a good way to check with your health care provider about your health and  preventative screening. It is a chance to share any concerns and updates on your health and to receive a thorough exam.  Visit your dentist for a routine exam and preventative care every 6 months. Brush your teeth twice a day and floss once a day. Good oral hygiene prevents tooth decay and gum disease.  The frequency of eye exams is based on your age, health, family medical history, use of contact lenses, and other factors. Follow your health care provider's recommendations for frequency of eye exams.  Eat a healthy diet. Foods such as vegetables, fruits, whole grains, low-fat dairy products, and lean protein foods contain the nutrients you need without too many calories. Decrease your intake of foods high in solid fats, added sugars, and salt. Eat the right amount of calories for you.Get information about a proper diet from your health care provider, if necessary.  Regular physical exercise is one of the most important things you can do for your health. Most adults should get at least 150 minutes of moderate-intensity exercise (any activity that increases your heart rate and causes you to sweat) each week. In addition, most adults need muscle-strengthening exercises on 2 or more days a week.  Maintain a healthy weight. The body mass index (BMI) is a screening tool to identify possible weight problems. It provides an estimate of body fat based on height and weight. Your health care provider can find your BMI and can help you achieve or maintain a healthy weight.For adults 20 years and older:  A BMI below 18.5 is considered underweight.  A BMI of 18.5 to 24.9 is normal.  A BMI of 25 to 29.9 is considered overweight.  A BMI of 30 and  above is considered obese.  Maintain normal blood lipids and cholesterol levels by exercising and minimizing your intake of saturated fat. Eat a balanced diet with plenty of fruit and vegetables. Blood tests for lipids and cholesterol should begin at age 20 and be  repeated every 5 years. If your lipid or cholesterol levels are high, you are over 50, or you are at high risk for heart disease, you may need your cholesterol levels checked more frequently.Ongoing high lipid and cholesterol levels should be treated with medicines if diet and exercise are not working.  If you smoke, find out from your health care provider how to quit. If you do not use tobacco, do not start.  Lung cancer screening is recommended for adults aged 82-80 years who are at high risk for developing lung cancer because of a history of smoking. A yearly low-dose CT scan of the lungs is recommended for people who have at least a 30-pack-year history of smoking and are a current smoker or have quit within the past 15 years. A pack year of smoking is smoking an average of 1 pack of cigarettes a day for 1 year (for example: 1 pack a day for 30 years or 2 packs a day for 15 years). Yearly screening should continue until the smoker has stopped smoking for at least 15 years. Yearly screening should be stopped for people who develop a health problem that would prevent them from having lung cancer treatment.  If you choose to drink alcohol, do not have more than 2 drinks per day. One drink is considered to be 12 ounces (355 mL) of beer, 5 ounces (148 mL) of wine, or 1.5 ounces (44 mL) of liquor.  Avoid use of street drugs. Do not share needles with anyone. Ask for help if you need support or instructions about stopping the use of drugs.  High blood pressure causes heart disease and increases the risk of stroke. Your blood pressure should be checked at least every 1-2 years. Ongoing high blood pressure should be treated with medicines, if weight loss and exercise are not effective.  If you are 63-91 years old, ask your health care provider if you should take aspirin to prevent heart disease.  Diabetes screening is done by taking a blood sample to check your blood glucose level after you have not eaten  for a certain period of time (fasting). If you are not overweight and you do not have risk factors for diabetes, you should be screened once every 3 years starting at age 58. If you are overweight or obese and you are 15-79 years of age, you should be screened for diabetes every year as part of your cardiovascular risk assessment.  Colorectal cancer can be detected and often prevented. Most routine colorectal cancer screening begins at the age of 32 and continues through age 67. However, your health care provider may recommend screening at an earlier age if you have risk factors for colon cancer. On a yearly basis, your health care provider may provide home test kits to check for hidden blood in the stool. Use of a small camera at the end of a tube to directly examine the colon (sigmoidoscopy or colonoscopy) can detect the earliest forms of colorectal cancer. Talk to your health care provider about this at age 13, when routine screening begins. Direct exam of the colon should be repeated every 5-10 years through age 66, unless early forms of precancerous polyps or small growths are found.  People who  are at an increased risk for hepatitis B should be screened for this virus. You are considered at high risk for hepatitis B if:  You were born in a country where hepatitis B occurs often. Talk with your health care provider about which countries are considered high risk.  Your parents were born in a high-risk country and you have not received a shot to protect against hepatitis B (hepatitis B vaccine).  You have HIV or AIDS.  You use needles to inject street drugs.  You live with, or have sex with, someone who has hepatitis B.  You are a man who has sex with other men (MSM).  You get hemodialysis treatment.  You take certain medicines for conditions such as cancer, organ transplantation, and autoimmune conditions.  Hepatitis C blood testing is recommended for all people born from [birth year] through 1965  and any individual with known risks for hepatitis C.  Practice safe sex. Use condoms and avoid high-risk sexual practices to reduce the spread of sexually transmitted infections (STIs). STIs include gonorrhea, chlamydia, syphilis, trichomonas, herpes, HPV, and human immunodeficiency virus (HIV). Herpes, HIV, and HPV are viral illnesses that have no cure. They can result in disability, cancer, and death.  If you are a man who has sex with other men, you should be screened at least once per year for:  HIV.  Urethral, rectal, and pharyngeal infection of gonorrhea, chlamydia, or both.  If you are at risk of being infected with HIV, it is recommended that you take a prescription medicine daily to prevent HIV infection. This is called preexposure prophylaxis (PrEP). You are considered at risk if:  You are a man who has sex with other men (MSM) and have other risk factors.  You are a heterosexual man, are sexually active, and are at increased risk for HIV infection.  You take drugs by injection.  You are sexually active with a partner who has HIV.  Talk with your health care provider about whether you are at high risk of being infected with HIV. If you choose to begin PrEP, you should first be tested for HIV. You should then be tested every 3 months for as long as you are taking PrEP.  A one-time screening for abdominal aortic aneurysm (AAA) and surgical repair of large AAAs by ultrasound are recommended for men ages 69 to 43 years who are current or former smokers.  Healthy men should no longer receive prostate-specific antigen (PSA) blood tests as part of routine cancer screening. Talk with your health care provider about prostate cancer screening.  Testicular cancer screening is not recommended for adult males who have no symptoms. Screening includes self-exam, a health care provider exam, and other screening tests. Consult with your health care provider about any symptoms you have or any  concerns you have about testicular cancer.  Use sunscreen. Apply sunscreen liberally and repeatedly throughout the day. You should seek shade when your shadow is shorter than you. Protect yourself by wearing long sleeves, pants, a wide-brimmed hat, and sunglasses year round, whenever you are outdoors.  Once a month, do a whole-body skin exam, using a mirror to look at the skin on your back. Tell your health care provider about new moles, moles that have irregular borders, moles that are larger than a pencil eraser, or moles that have changed in shape or color.  Stay current with required vaccines (immunizations).  Influenza vaccine. All adults should be immunized every year.  Tetanus, diphtheria, and acellular pertussis (Td,  Tdap) vaccine. An adult who has not previously received Tdap or who does not know his vaccine status should receive 1 dose of Tdap. This initial dose should be followed by tetanus and diphtheria toxoids (Td) booster doses every 10 years. Adults with an unknown or incomplete history of completing a 3-dose immunization series with Td-containing vaccines should begin or complete a primary immunization series including a Tdap dose. Adults should receive a Td booster every 10 years.  Varicella vaccine. An adult without evidence of immunity to varicella should receive 2 doses or a second dose if he has previously received 1 dose.  Human papillomavirus (HPV) vaccine. Males aged 11-21 years who have not received the vaccine previously should receive the 3-dose series. Males aged 22-26 years may be immunized. Immunization is recommended through the age of 65 years for any male who has sex with males and did not get any or all doses earlier. Immunization is recommended for any person with an immunocompromised condition through the age of 1 years if he did not get any or all doses earlier. During the 3-dose series, the second dose should be obtained 4-8 weeks after the first dose. The third  dose should be obtained 24 weeks after the first dose and 16 weeks after the second dose.  Zoster vaccine. One dose is recommended for adults aged 56 years or older unless certain conditions are present.  Measles, mumps, and rubella (MMR) vaccine. Adults born before [birth year] generally are considered immune to measles and mumps. Adults born in [birth year] or later should have 1 or more doses of MMR vaccine unless there is a contraindication to the vaccine or there is laboratory evidence of immunity to each of the three diseases. A routine second dose of MMR vaccine should be obtained at least 28 days after the first dose for students attending postsecondary schools, health care workers, or international travelers. People who received inactivated measles vaccine or an unknown type of measles vaccine during 1963-1967 should receive 2 doses of MMR vaccine. People who received inactivated mumps vaccine or an unknown type of mumps vaccine before 1979 and are at high risk for mumps infection should consider immunization with 2 doses of MMR vaccine. Unvaccinated health care workers born before [birth year] who lack laboratory evidence of measles, mumps, or rubella immunity or laboratory confirmation of disease should consider measles and mumps immunization with 2 doses of MMR vaccine or rubella immunization with 1 dose of MMR vaccine.  Pneumococcal 13-valent conjugate (PCV13) vaccine. When indicated, a person who is uncertain of his immunization history and has no record of immunization should receive the PCV13 vaccine. All adults 69 years of age and older should receive this vaccine. An adult aged 58 years or older who has certain medical conditions and has not been previously immunized should receive 1 dose of PCV13 vaccine. This PCV13 should be followed with a dose of pneumococcal polysaccharide (PPSV23) vaccine. Adults who are at high risk for pneumococcal disease should obtain the PPSV23 vaccine at least 8 weeks after the dose of  PCV13 vaccine. Adults older than 51 years of age who have normal immune system function should obtain the PPSV23 vaccine dose at least 1 year after the dose of PCV13 vaccine.  Pneumococcal polysaccharide (PPSV23) vaccine. When PCV13 is also indicated, PCV13 should be obtained first. All adults aged 59 years and older should be immunized. An adult younger than age 86 years who has certain medical conditions should be immunized. Any person who resides in a nursing home  or long-term care facility should be immunized. An adult smoker should be immunized. People with an immunocompromised condition and certain other conditions should receive both PCV13 and PPSV23 vaccines. People with human immunodeficiency virus (HIV) infection should be immunized as soon as possible after diagnosis. Immunization during chemotherapy or radiation therapy should be avoided. Routine use of PPSV23 vaccine is not recommended for American Indians, [place] Natives, or people younger than 65 years unless there are medical conditions that require PPSV23 vaccine. When indicated, people who have unknown immunization and have no record of immunization should receive PPSV23 vaccine. One-time revaccination 5 years after the first dose of PPSV23 is recommended for people aged 19-64 years who have chronic kidney failure, nephrotic syndrome, asplenia, or immunocompromised conditions. People who received 1-2 doses of PPSV23 before age 11 years should receive another dose of PPSV23 vaccine at age 22 years or later if at least 5 years have passed since the previous dose. Doses of PPSV23 are not needed for people immunized with PPSV23 at or after age 31 years.  Meningococcal vaccine. Adults with asplenia or persistent complement component deficiencies should receive 2 doses of quadrivalent meningococcal conjugate (MenACWY-D) vaccine. The doses should be obtained at least 2 months apart. Microbiologists working with certain meningococcal bacteria, [place]  recruits, people at risk during an outbreak, and people who travel to or live in countries with a high rate of meningitis should be immunized. A first-year college student up through age 39 years who is living in a residence hall should receive a dose if he did not receive a dose on or after his 16th birthday. Adults who have certain high-risk conditions should receive one or more doses of vaccine.  Hepatitis A vaccine. Adults who wish to be protected from this disease, have chronic liver disease, work with hepatitis A-infected animals, work in hepatitis A research labs, or travel to or work in countries with a high rate of hepatitis A should be immunized. Adults who were previously unvaccinated and who anticipate close contact with an international adoptee during the first 60 days after arrival in the [place] from a country with a high rate of hepatitis A should be immunized.  Hepatitis B vaccine. Adults should be immunized if they wish to be protected from this disease, are under age 60 years and have diabetes, have chronic liver disease, have had more than one sex partner in the past 6 months, may be exposed to blood or other infectious body fluids, are household contacts or sex partners of hepatitis B positive people, are clients or workers in certain care facilities, or travel to or work in countries with a high rate of hepatitis B.  Haemophilus influenzae type b (Hib) vaccine. A previously unvaccinated person with asplenia or sickle cell disease or having a scheduled splenectomy should receive 1 dose of Hib vaccine. Regardless of previous immunization, a recipient of a hematopoietic stem cell transplant should receive a 3-dose series 6-12 months after his successful transplant. Hib vaccine is not recommended for adults with HIV infection. Preventive Service / Frequency Ages 51 to 11  Blood pressure check.** / Every 3-5 years.  Lipid and cholesterol check.** / Every 5 years beginning at age  22.  Hepatitis C blood test.** / For any individual with known risks for hepatitis C.  Skin self-exam. / Monthly.  Influenza vaccine. / Every year.  Tetanus, diphtheria, and acellular pertussis (Tdap, Td) vaccine.** / Consult your health care provider. 1 dose of Td every 10 years.  Varicella vaccine.** /  Consult your health care provider.  HPV vaccine. / 3 doses over 6 months, if 12 or younger.  Measles, mumps, rubella (MMR) vaccine.** / You need at least 1 dose of MMR if you were born in 1957 or later. You may also need a second dose.  Pneumococcal 13-valent conjugate (PCV13) vaccine.** / Consult your health care provider.  Pneumococcal polysaccharide (PPSV23) vaccine.** / 1 to 2 doses if you smoke cigarettes or if you have certain conditions.  Meningococcal vaccine.** / 1 dose if you are age 45 to 8 years and a Market researcher living in a residence hall, or have one of several medical conditions. You may also need additional booster doses.  Hepatitis A vaccine.** / Consult your health care provider.  Hepatitis B vaccine.** / Consult your health care provider.  Haemophilus influenzae type b (Hib) vaccine.** / Consult your health care provider. Ages 50 to 72  Blood pressure check.** / Every year.  Lipid and cholesterol check.** / Every 5 years beginning at age 27.  Lung cancer screening. / Every year if you are aged 27-80 years and have a 30-pack-year history of smoking and currently smoke or have quit within the past 15 years. Yearly screening is stopped once you have quit smoking for at least 15 years or develop a health problem that would prevent you from having lung cancer treatment.  Fecal occult blood test (FOBT) of stool. / Every year beginning at age 86 and continuing until age 50. You may not have to do this test if you get a colonoscopy every 10 years.  Flexible sigmoidoscopy** or colonoscopy.** / Every 5 years for a flexible sigmoidoscopy or every 10 years  for a colonoscopy beginning at age 40 and continuing until age 22.  Hepatitis C blood test.** / For all people born from [birth year] through 1965 and any individual with known risks for hepatitis C.  Skin self-exam. / Monthly.  Influenza vaccine. / Every year.  Tetanus, diphtheria, and acellular pertussis (Tdap/Td) vaccine.** / Consult your health care provider. 1 dose of Td every 10 years.  Varicella vaccine.** / Consult your health care provider.  Zoster vaccine.** / 1 dose for adults aged 73 years or older.  Measles, mumps, rubella (MMR) vaccine.** / You need at least 1 dose of MMR if you were born in 1957 or later. You may also need a second dose.  Pneumococcal 13-valent conjugate (PCV13) vaccine.** / Consult your health care provider.  Pneumococcal polysaccharide (PPSV23) vaccine.** / 1 to 2 doses if you smoke cigarettes or if you have certain conditions.  Meningococcal vaccine.** / Consult your health care provider.  Hepatitis A vaccine.** / Consult your health care provider.  Hepatitis B vaccine.** / Consult your health care provider.  Haemophilus influenzae type b (Hib) vaccine.** / Consult your health care provider. Ages 30 and over  Blood pressure check.** / Every year.  Lipid and cholesterol check.**/ Every 5 years beginning at age 69.  Lung cancer screening. / Every year if you are aged 71-80 years and have a 30-pack-year history of smoking and currently smoke or have quit within the past 15 years. Yearly screening is stopped once you have quit smoking for at least 15 years or develop a health problem that would prevent you from having lung cancer treatment.  Fecal occult blood test (FOBT) of stool. / Every year beginning at age 2 and continuing until age 70. You may not have to do this test if you get a colonoscopy every 10 years.  Flexible sigmoidoscopy** or colonoscopy.** / Every 5 years for a flexible sigmoidoscopy or every 10 years for a colonoscopy beginning at age 65  and continuing until age 33.  Hepatitis C blood test.** / For all people born from [birth year] through 1966 and any individual with known risks for hepatitis C.  Abdominal aortic aneurysm (AAA) screening.** / A one-time screening for ages 3 to 40 years who are current or former smokers.  Skin self-exam. / Monthly.  Influenza vaccine. / Every year.  Tetanus, diphtheria, and acellular pertussis (Tdap/Td) vaccine.** / 1 dose of Td every 10 years.  Varicella vaccine.** / Consult your health care provider.  Zoster vaccine.** / 1 dose for adults aged 19 years or older.  Pneumococcal 13-valent conjugate (PCV13) vaccine.** / 1 dose for all adults aged 33 years and older.  Pneumococcal polysaccharide (PPSV23) vaccine.** / 1 dose for all adults aged 6 years and older.  Meningococcal vaccine.** / Consult your health care provider.  Hepatitis A vaccine.** / Consult your health care provider.  Hepatitis B vaccine.** / Consult your health care provider.  Haemophilus influenzae type b (Hib) vaccine.** / Consult your health care provider. **Family history and personal history of risk and conditions may change your health care provider's recommendations.   This information is not intended to replace advice given to you by your health care provider. Make sure you discuss any questions you have with your health care provider.   Document Released: 09/25/2001 Document Revised: 08/20/2014 Document Reviewed: 12/25/2010 Elsevier Interactive Patient Education [employer].

## 2016-01-20 NOTE — Progress Notes (Signed)
Patient presents to clinic today for annual exam.  Patient is fasting for labs.  Hypertension -- Patient is currently on hydrochlorothiazide 25 mg daily. Is taking at night. Patient denies chest pain, palpitations, lightheadedness, dizziness, vision changes or frequent headaches. Does note some fatigue.   BP Readings from Last 3 Encounters:  01/20/16 167/109  11/16/15 170/100  05/02/15 128/84   Patient endorses history of low testosterone. Has noted fatigue over the pat several months. Is feeling very sleepy during the day. Is sleeping well at night. Wife endorses snoring heavily at home. Has had previous abnormal sleep study many years ago. Is not on a CPAP machine. Body mass index is 33.82 kg/(m^2).    Health Maintenance: Immunizations -- Tetanus up-to-date Colonoscopy -- Patient overdue. Denies family history of CRC. Denies change in stools, bloody stools, etc. Declines today. Declines. Discussed Cologuard.  Past Medical History  Diagnosis Date  . Stroke (HCC) 2012  . History of chicken pox     6317 Mths old  . Hypertension   . Hyperlipidemia   . Low testosterone   . Asthma     Past Surgical History  Procedure Laterality Date  . Achilles tendon repair      Left Leg    Current Outpatient Prescriptions on File Prior to Visit  Medication Sig Dispense Refill  . aspirin 325 MG EC tablet Take 325 mg by mouth daily.    Marland Kitchen. buPROPion (WELLBUTRIN SR) 150 MG 12 hr tablet Take 1 tablet (150 mg total) by mouth 2 (two) times daily. 180 tablet 1  . fexofenadine (ALLEGRA) 180 MG tablet Take 180 mg by mouth daily as needed for allergies or rhinitis. Reported on 11/16/2015    . hydrochlorothiazide (HYDRODIURIL) 25 MG tablet Take 1 tablet (25 mg total) by mouth daily. 30 tablet 3  . sertraline (ZOLOFT) 50 MG tablet Take 1 tablet (50 mg total) by mouth daily. 90 tablet 1   No current facility-administered medications on file prior to visit.    No Known Allergies  Family History    Problem Relation Age of Onset  . Hypertension Father   . Stroke Father     Late 450s  . Diabetes Father   . Sinusitis Mother   . Kidney disease Maternal Aunt   . Hypertension Maternal Aunt   . Liver cancer Maternal Aunt   . Hypertension Paternal Grandmother   . Hypertension Maternal Grandfather   . Alzheimer's disease Maternal Grandmother   . Aneurysm Paternal Grandmother   . Heart disease Maternal Grandmother   . Hypertension Paternal Grandfather   . Stroke Paternal Grandfather   . Healthy Sister   . Healthy Daughter   . Healthy Son     Social History   Social History  . Marital Status: Married    Spouse Name: N/A  . Number of Children: N/A  . Years of Education: N/A   Occupational History  . Not on file.   Social History Main Topics  . Smoking status: Never Smoker   . Smokeless tobacco: Never Used  . Alcohol Use: No     Comment: Former Use  . Drug Use: No  . Sexual Activity: Not Currently   Other Topics Concern  . Not on file   Social History Narrative   Review of Systems  Constitutional: Positive for malaise/fatigue. Negative for fever and weight loss.  HENT: Negative for ear discharge, ear pain, hearing loss and tinnitus.   Eyes: Negative for blurred vision, double vision, photophobia and  pain.  Respiratory: Negative for cough and shortness of breath.   Cardiovascular: Negative for chest pain and palpitations.  Gastrointestinal: Negative for heartburn, nausea, vomiting, abdominal pain, diarrhea, constipation, blood in stool and melena.  Genitourinary: Negative for dysuria, urgency, frequency, hematuria and flank pain.       Decreased libido. Nocturia x 0-1.   Musculoskeletal: Negative for falls.  Neurological: Negative for dizziness, loss of consciousness and headaches.  Endo/Heme/Allergies: Negative for environmental allergies.  Psychiatric/Behavioral: Negative for depression, suicidal ideas, hallucinations and substance abuse. The patient is not  nervous/anxious and does not have insomnia.    BP 167/109 mmHg  Pulse 69  Temp(Src) 98.2 F (36.8 C) (Oral)  Resp 16  Ht  (1.854 m)  Wt 256 lb 4 oz (116.234 kg)  BMI 33.82 kg/m2  SpO2 100%  Physical Exam  Constitutional: He is oriented to person, place, and time and well-developed, well-nourished, and in no distress.  HENT:  Head: Normocephalic and atraumatic.  Right Ear: External ear normal.  Left Ear: External ear normal.  Nose: Nose normal.  Mouth/Throat: Oropharynx is clear and moist. No oropharyngeal exudate.  Eyes: Conjunctivae and EOM are normal. Pupils are equal, round, and reactive to light.  Neck: Neck supple. No thyromegaly present.  Cardiovascular: Normal rate, regular rhythm, normal heart sounds and intact distal pulses.   Pulmonary/Chest: Effort normal and breath sounds normal. No respiratory distress. He has no wheezes. He has no rales. He exhibits no tenderness.  Abdominal: Soft. Bowel sounds are normal. He exhibits no distension and no mass. There is no tenderness. There is no rebound and no guarding.  Genitourinary: Testes/scrotum normal.  Lymphadenopathy:    He has no cervical adenopathy.  Neurological: He is alert and oriented to person, place, and time.  Skin: Skin is warm and dry. No rash noted.  Psychiatric: Affect normal.  Vitals reviewed.  No results found for this or any previous visit (from the past 2160 hour(s)).  Assessment/Plan: Essential hypertension, benign Uncontrolled. Suspect sleep apnea a major component. Referral to Sleep Medicine placed. Diet and exercise encouraged to promote weight loss. DASH diet recommended. Will continue HCTZ. Will add-on lisinopril 10 mg daily. Will check metabolic panel. FU 1 month.  Low testosterone Patient endorses history. Current symptoms -- decreased focus, fatigue, low sex drive. Will check level.  Visit for preventive health examination Depression screen negative. Health Maintenance reviewed --  immunizations up-to-date. Declines Colonoscopy. Discussed Cologuard. He will check with insurance for coverage. Will order if he wishes. Preventive schedule discussed and handout given in AVS. Will obtain fasting labs today.   Daytime somnolence With snoring and apneic episodes. History of abnormal sleep study in the far past. Referral to Pulmonary/Sleep Medicine placed for sleep study. Likely will need CPAP. Discussed weight loss to help with this. His goal is 150 minutes of cardio per week.  Prostate cancer screening The natural history of prostate cancer and ongoing controversy regarding screening and potential treatment outcomes of prostate cancer has been discussed with the patient. The meaning of a false positive PSA and a false negative PSA has been discussed. He indicates understanding of the limitations of this screening test and wishes to proceed with screening PSA testing.     Piedad Climes, PA-C

## 2016-01-20 NOTE — Assessment & Plan Note (Signed)
Patient endorses history. Current symptoms -- decreased focus, fatigue, low sex drive. Will check level.

## 2016-01-20 NOTE — Progress Notes (Signed)
Pre visit review using our clinic review tool, if applicable. No additional management support is needed unless otherwise documented below in the visit note/SLS  

## 2016-01-24 ENCOUNTER — Telehealth: Payer: Self-pay | Admitting: Physician Assistant

## 2016-01-24 ENCOUNTER — Other Ambulatory Visit: Payer: PRIVATE HEALTH INSURANCE

## 2016-01-24 DIAGNOSIS — Z8673 Personal history of transient ischemic attack (TIA), and cerebral infarction without residual deficits: Secondary | ICD-10-CM

## 2016-01-24 DIAGNOSIS — R29898 Other symptoms and signs involving the musculoskeletal system: Secondary | ICD-10-CM

## 2016-01-24 LAB — PSA: PSA: 0.68 ng/mL (ref 0.10–4.00)

## 2016-01-24 LAB — LIPID PANEL
CHOLESTEROL: 216 mg/dL — AB (ref 0–200)
HDL: 47.1 mg/dL (ref >39.00)
LDL Cholesterol: 150 mg/dL — ABNORMAL HIGH (ref 0–99)
NonHDL: 169.31
TRIGLYCERIDES: 97 mg/dL (ref 0.0–149.0)
Total CHOL/HDL Ratio: 5
VLDL: 19.4 mg/dL (ref 0.0–40.0)

## 2016-01-24 LAB — URINALYSIS, ROUTINE W REFLEX MICROSCOPIC
BILIRUBIN URINE: NEGATIVE
Hgb urine dipstick: NEGATIVE
KETONES UR: NEGATIVE
LEUKOCYTES UA: NEGATIVE
NITRITE: NEGATIVE
PH: 7 (ref 5.0–8.0)
RBC / HPF: NONE SEEN (ref 0–?)
Specific Gravity, Urine: 1.01 (ref 1.000–1.030)
Total Protein, Urine: NEGATIVE
UROBILINOGEN UA: 0.2 (ref 0.0–1.0)
Urine Glucose: NEGATIVE

## 2016-01-24 LAB — COMPREHENSIVE METABOLIC PANEL
ALK PHOS: 71 U/L (ref 39–117)
ALT: 15 U/L (ref 0–53)
AST: 16 U/L (ref 0–37)
Albumin: 4.1 g/dL (ref 3.5–5.2)
BILIRUBIN TOTAL: 0.4 mg/dL (ref 0.2–1.2)
BUN: 16 mg/dL (ref 6–23)
CALCIUM: 9.4 mg/dL (ref 8.4–10.5)
CO2: 26 mEq/L (ref 19–32)
Chloride: 101 mEq/L (ref 96–112)
Creatinine, Ser: 1.44 mg/dL (ref 0.40–1.50)
GFR: 66.61 mL/min (ref >60.00)
Glucose, Bld: 105 mg/dL — ABNORMAL HIGH (ref 70–99)
Potassium: 3.5 mEq/L (ref 3.5–5.1)
Sodium: 137 mEq/L (ref 135–145)
TOTAL PROTEIN: 7.3 g/dL (ref 6.0–8.3)

## 2016-01-24 LAB — TSH: TSH: 1.41 u[IU]/mL (ref 0.35–4.50)

## 2016-01-24 LAB — CBC
HEMATOCRIT: 40.8 % (ref 39.0–52.0)
HEMOGLOBIN: 13.4 g/dL (ref 13.0–17.0)
MCHC: 33 g/dL (ref 30.0–36.0)
MCV: 81.1 fl (ref 78.0–100.0)
RBC: 5.03 Mil/uL (ref 4.22–5.81)
RDW: 15.4 % (ref 11.5–15.5)
WBC: 6.1 10*3/uL (ref 4.0–10.5)

## 2016-01-24 LAB — HEMOGLOBIN A1C: Hgb A1c MFr Bld: 6.3 % (ref 4.6–6.5)

## 2016-01-24 LAB — TESTOSTERONE: TESTOSTERONE: 257.67 ng/dL — AB (ref 300.00–890.00)

## 2016-01-24 NOTE — Telephone Encounter (Signed)
Called patient to inform him that the letter he received is notifying him of the fee for 01/18/16 being waived. There is no outstanding balance as this charges was waived for this one time. Patient did not have a voicemail set up

## 2016-01-24 NOTE — Telephone Encounter (Signed)
Caller name: Minerva Areolaric  Relation to pt: self  Call back number: [phone] Pharmacy:  Reason for call: Pt came in office left a copy of letter being charged a NO SHOW fee on 01-18-16, pt states had received a call from our office the day before appt to confirm appt on 01-18-16 but pt told person who he spoked with that day that had cancel appt for another day since he could not come in on the 7th, pt does not want to be charged no show fee. Please advise.

## 2016-01-30 ENCOUNTER — Telehealth: Payer: Self-pay | Admitting: Physician Assistant

## 2016-01-30 NOTE — Telephone Encounter (Signed)
Caller name: Mindi JunkerMarsha Relationship to patient: Wife Can be reached: [phone]     Reason for call: FYI: Wife called to inform provider that patient had a mild stroke over the weekend and is a [facility] Room 407

## 2016-01-30 NOTE — Telephone Encounter (Signed)
Wife states she appreciates that.

## 2016-01-30 NOTE — Telephone Encounter (Signed)
Thank you for making me aware. I will try to keep an eye on his progress through Care everywhere.

## 2016-02-06 ENCOUNTER — Telehealth: Payer: Self-pay | Admitting: Physician Assistant

## 2016-02-06 ENCOUNTER — Ambulatory Visit (HOSPITAL_BASED_OUTPATIENT_CLINIC_OR_DEPARTMENT_OTHER)
Admission: RE | Admit: 2016-02-06 | Discharge: 2016-02-06 | Disposition: A | Discharge status: Home / Self Care | Payer: PRIVATE HEALTH INSURANCE | Source: Ambulatory Visit | Attending: Medical | Admitting: Medical

## 2016-02-06 ENCOUNTER — Encounter: Payer: Self-pay | Admitting: Medical

## 2016-02-06 ENCOUNTER — Ambulatory Visit (INDEPENDENT_AMBULATORY_CARE_PROVIDER_SITE_OTHER): Payer: PRIVATE HEALTH INSURANCE | Admitting: Medical

## 2016-02-06 VITALS — BP 140/85 | HR 73 | Temp 97.8°F | Ht 73.0 in | Wt 250.8 lb

## 2016-02-06 DIAGNOSIS — Z8673 Personal history of transient ischemic attack (TIA), and cerebral infarction without residual deficits: Secondary | ICD-10-CM

## 2016-02-06 DIAGNOSIS — R29898 Other symptoms and signs involving the musculoskeletal system: Secondary | ICD-10-CM | POA: Diagnosis not present

## 2016-02-06 DIAGNOSIS — R600 Localized edema: Secondary | ICD-10-CM | POA: Diagnosis not present

## 2016-02-06 DIAGNOSIS — N289 Disorder of kidney and ureter, unspecified: Secondary | ICD-10-CM

## 2016-02-06 DIAGNOSIS — I6330 Cerebral infarction due to thrombosis of unspecified cerebral artery: Secondary | ICD-10-CM | POA: Diagnosis not present

## 2016-02-06 NOTE — Progress Notes (Signed)
Subjective:    Patient ID: Antonio Ellison, male    DOB: 1965-11-07, 50 y.o.   MRN: [id number]  HPI  [facility].  Pt in with wife. Pt was walking with his wife on Sunday. On the way back he was having hard time walking. Left side of his arm felt cold. Rt side of felt cold. Then went to ED for evaluation that day.   Pt wife states he had some weakness of rt arm while he was admitted. Pt wife states Monday another mri was done that day.  Pt has also some swelling of his feet last couple of day.  Pt wife states some kidney function decrease while in hospital.  Pt k was mild low.   Pt sugar was mild high.   Pt was diagnosed with cva. Pt wants to have PT done locally and not at home.  Some new pedal edema last 2-3 days. No dyspnea.   Pt has hyperlipidemia. Pt on atorvastatin.  Pt feels like overall minimal improvement rt upper ext and lower ext since past wed when discharged.  Note did find imaging studies and reviewed. One mri  report did show rt side frontal white matter on 01-29-2016. On  01-30-2016 mri did not find any abnormality.  I did find later later o review discharge summary Right middle cerebral artery ischemic stroke. Patient was treated as per non-tPA ischemic stroke management protocol. Patient was on aspirin and changed to Plavix and plan is to continue Plavix, patient was on pravastatin and LDL level is very high so he was changed to Lipitor and dose was increased. Neurology was following the patient closely. Patient was seen by PT, OT and speech. Patient needs physical therapy to continue at home.     Review of Systems  Constitutional: Negative for fever, chills, diaphoresis, activity change and fatigue.  Respiratory: Negative for cough, chest tightness and shortness of breath.   Cardiovascular: Negative for chest pain, palpitations and leg swelling.  Gastrointestinal: Negative for nausea, vomiting and abdominal pain.  Musculoskeletal: Negative for  neck pain and neck stiffness.  Neurological: Negative for dizziness, seizures, facial asymmetry, speech difficulty, weakness and light-headedness.       Rt arm weakness.  Psychiatric/Behavioral: Negative for behavioral problems, confusion and agitation. The patient is not nervous/anxious.    Past Medical History  Diagnosis Date  . Stroke (HCC) 2012  . History of chicken pox     16  Mths old  . Hypertension   . Hyperlipidemia   . Low testosterone   . Asthma      Social History   Social History  . Marital Status: Married    Spouse Name: N/A  . Number of Children: N/A  . Years of Education: N/A   Occupational History  . Not on file.   Social History Main Topics  . Smoking status: Never Smoker   . Smokeless tobacco: Never Used  . Alcohol Use: No     Comment: Former Use  . Drug Use: No  . Sexual Activity: Not Currently   Other Topics Concern  . Not on file   Social History Narrative    Past Surgical History  Procedure Laterality Date  . Achilles tendon repair      Left Leg    Family History  Problem Relation Age of Onset  . Hypertension Father   . Stroke Father     Late 5050s  . Diabetes Father   . Sinusitis Mother   .  Kidney disease Maternal Aunt   . Hypertension Maternal Aunt   . Liver cancer Maternal Aunt   . Hypertension Paternal Grandmother   . Hypertension Maternal Grandfather   . Alzheimer's disease Maternal Grandmother   . Aneurysm Paternal Grandmother   . Heart disease Maternal Grandmother   . Hypertension Paternal Grandfather   . Stroke Paternal Grandfather   . Healthy Sister   . Healthy Daughter   . Healthy Son     No Known Allergies  Current Outpatient Prescriptions on File Prior to Visit  Medication Sig Dispense Refill  . buPROPion (WELLBUTRIN SR) 150 MG 12 hr tablet Take 1 tablet (150 mg total) by mouth 2 (two) times daily. 180 tablet 1  . fexofenadine (ALLEGRA) 180 MG tablet Take 180 mg by mouth daily as needed for allergies or  rhinitis. Reported on 11/16/2015    . lisinopril (PRINIVIL,ZESTRIL) 10 MG tablet Take 1 tablet (10 mg total) by mouth daily. 30 tablet 3  . sertraline (ZOLOFT) 50 MG tablet Take 1 tablet (50 mg total) by mouth daily. 90 tablet 1   No current facility-administered medications on file prior to visit.    BP 140/85 mmHg  Pulse 73  Temp(Src) 97.8 F (36.6 C) (Oral)  Ht 6\' 1"  (1.854 m)  Wt 250 lb 12.8 oz (113.762 kg)  BMI 33.10 kg/m2  SpO2 99%       Objective:   Physical Exam  General Mental Status- Alert. General Appearance- Not in acute distress.   Skin General: Color- Normal Color. Moisture- Normal Moisture.  Neck Carotid Arteries- Normal color. Moisture- Normal Moisture. No carotid bruits. No JVD.  Chest and Lung Exam Auscultation: Breath Sounds:-Normal.  Cardiovascular Auscultation:Rythm- Regular. Murmurs & Other Heart Sounds:Auscultation of the heart reveals- No Murmurs.  Abdomen Inspection:-Inspeection Normal. Palpation/Percussion:Note:No mass. Palpation and Percussion of the abdomen reveal- Non Tender, Non Distended + BS, no rebound or guarding.    Neurologic Cranial Nerve exam:- CN III-XII intact(No nystagmus), symmetric smile. Drift Test:- No drift. Romberg Exam:- Negative.  Heal to Toe Gait exam:-Normal. Finger to Nose:- Normal/Intact Strength:- Rt upper ext 3/5 strength. Slow movements but good coordination.Rt lower ext shows good rom. Just slow movements. Rt lower ext 4/5 strength.  Left upper and lower ext 5/5.      Assessment & Plan:  For your recent stroke and arm weakness I want you to continue current medications.  We are doing labs to assess kidney function, low k level and your pedal edema. Also will get cxr to assess if any fluid in lungs and evaluate heart size.  Contact Victorino DikeJennifer if any questions on referrals.  If any new or worsening symptoms from current baseline then ED evaluation. Pt and wife expressed understanding.  Follow up here in  7-10 days if any delay in getting in with neurology.   Antonio Ellison, Antonio DredgeEdward, PA-C

## 2016-02-06 NOTE — Progress Notes (Signed)
Pre visit review using our clinic tool,if applicable. No additional management support is needed unless otherwise documented below in the visit note.  

## 2016-02-06 NOTE — Telephone Encounter (Signed)
Error

## 2016-02-06 NOTE — Patient Instructions (Addendum)
For your recent stroke and arm weakness I want you to continue current medications.  We are doing labs to assess kidney function, low k level and your pedal edema. Also will get cxr to assess if any fluid in lungs and evaluate heart size.  Contact Victorino DikeJennifer if any questions on referrals.  If any new or worsening symptoms from current baseline then ED evaluation.  Follow up here in 7-10 days if any delay in getting in with neurology.

## 2016-02-07 ENCOUNTER — Other Ambulatory Visit (INDEPENDENT_AMBULATORY_CARE_PROVIDER_SITE_OTHER): Payer: PRIVATE HEALTH INSURANCE

## 2016-02-07 DIAGNOSIS — R600 Localized edema: Secondary | ICD-10-CM | POA: Diagnosis not present

## 2016-02-07 DIAGNOSIS — N289 Disorder of kidney and ureter, unspecified: Secondary | ICD-10-CM | POA: Diagnosis not present

## 2016-02-07 DIAGNOSIS — I6330 Cerebral infarction due to thrombosis of unspecified cerebral artery: Secondary | ICD-10-CM | POA: Diagnosis not present

## 2016-02-07 LAB — CBC WITH DIFFERENTIAL/PLATELET
BASOS PCT: 0.4 % (ref 0.0–3.0)
Basophils Absolute: 0 10*3/uL (ref 0.0–0.1)
EOS PCT: 3.6 % (ref 0.0–5.0)
Eosinophils Absolute: 0.2 10*3/uL (ref 0.0–0.7)
HCT: 43.2 % (ref 39.0–52.0)
Hemoglobin: 14.1 g/dL (ref 13.0–17.0)
LYMPHS ABS: 2.4 10*3/uL (ref 0.7–4.0)
Lymphocytes Relative: 40.4 % (ref 12.0–46.0)
MCHC: 32.7 g/dL (ref 30.0–36.0)
MCV: 81.2 fl (ref 78.0–100.0)
MONO ABS: 0.5 10*3/uL (ref 0.1–1.0)
Monocytes Relative: 9.1 % (ref 3.0–12.0)
NEUTROS ABS: 2.7 10*3/uL (ref 1.4–7.7)
NEUTROS PCT: 46.5 % (ref 43.0–77.0)
Platelets: 193 10*3/uL (ref 150.0–400.0)
RBC: 5.33 Mil/uL (ref 4.22–5.81)
RDW: 15.1 % (ref 11.5–15.5)
WBC: 5.9 10*3/uL (ref 4.0–10.5)

## 2016-02-07 LAB — COMPREHENSIVE METABOLIC PANEL
ALBUMIN: 4.4 g/dL (ref 3.5–5.2)
ALT: 25 U/L (ref 0–53)
AST: 31 U/L (ref 0–37)
Alkaline Phosphatase: 75 U/L (ref 39–117)
BUN: 16 mg/dL (ref 6–23)
CHLORIDE: 102 meq/L (ref 96–112)
CO2: 28 mEq/L (ref 19–32)
Calcium: 10.2 mg/dL (ref 8.4–10.5)
Creatinine, Ser: 1.39 mg/dL (ref 0.40–1.50)
GFR: 69.37 mL/min (ref >60.00)
GLUCOSE: 93 mg/dL (ref 70–99)
POTASSIUM: 3.9 meq/L (ref 3.5–5.1)
SODIUM: 136 meq/L (ref 135–145)
Total Bilirubin: 0.5 mg/dL (ref 0.2–1.2)
Total Protein: 8.3 g/dL (ref 6.0–8.3)

## 2016-02-07 LAB — BRAIN NATRIURETIC PEPTIDE: PRO B NATRI PEPTIDE: 11 pg/mL (ref 0.0–100.0)

## 2016-02-09 NOTE — Telephone Encounter (Signed)
Patient is not scheduled yet, although Neurology has called to schedule him, had to leave message.

## 2016-02-09 NOTE — Telephone Encounter (Signed)
Pt refused cardiology referral(thanks for update). What about neruuologist?

## 2016-02-09 NOTE — Telephone Encounter (Signed)
Would you call pt and explain that they already called him in case they don't get message which sometimes happens. Would you give them number of neurologist office.

## 2016-02-15 ENCOUNTER — Encounter: Payer: Self-pay | Admitting: Occupational Therapy

## 2016-02-15 ENCOUNTER — Telehealth: Payer: Self-pay | Admitting: Occupational Therapy

## 2016-02-15 ENCOUNTER — Ambulatory Visit: Payer: PRIVATE HEALTH INSURANCE | Attending: Medical | Admitting: Occupational Therapy

## 2016-02-15 DIAGNOSIS — M79601 Pain in right arm: Secondary | ICD-10-CM | POA: Diagnosis present

## 2016-02-15 DIAGNOSIS — M545 Low back pain: Secondary | ICD-10-CM | POA: Insufficient documentation

## 2016-02-15 DIAGNOSIS — R278 Other lack of coordination: Secondary | ICD-10-CM | POA: Insufficient documentation

## 2016-02-15 DIAGNOSIS — I69351 Hemiplegia and hemiparesis following cerebral infarction affecting right dominant side: Secondary | ICD-10-CM | POA: Insufficient documentation

## 2016-02-15 DIAGNOSIS — M6281 Muscle weakness (generalized): Secondary | ICD-10-CM | POA: Insufficient documentation

## 2016-02-15 NOTE — Telephone Encounter (Signed)
Please send P.T. Referral via [facility] to [facility] outpatient location to address:  RLE weakness and c/o dizziness following CVA.  (Pt originally had P.T. referral for arm weakness, which O.T. is addressing, but need updated PT referral for Rt leg weakness following CVA)

## 2016-02-15 NOTE — Therapy (Signed)
[facility] [address]  [address] [place], [place], [address] Phone: [phone]   Fax:  [phone]  Occupational Therapy Evaluation  Patient Details  Name: Antonio Natalric Mulligan MRN: [id number] Date of Birth: Sep 25, 1965 Referring Provider: Esperanza RichtersEdward Saguier PA-C  Encounter Date: 02/15/2016      OT End of Session - 02/15/16 1350    Visit Number 1   Number of Visits 17   Date for OT Re-Evaluation 04/16/16   Authorization Type [employer]   Authorization Time Period 60 visit limit (PT referral requested)    Authorization - Visit Number 1   Authorization - Number of Visits 30   OT Start Time 1105   OT Stop Time 1205   OT Time Calculation (min) 60 min   Activity Tolerance Patient tolerated treatment well      Past Medical History  Diagnosis Date  . Stroke (HCC) 2012  . History of chicken pox     4717 Mths old  . Hypertension   . Hyperlipidemia   . Low testosterone   . Asthma     Past Surgical History  Procedure Laterality Date  . Achilles tendon repair      Left Leg    There were no vitals filed for this visit.      Subjective Assessment - 02/15/16 1121    Subjective  I went into the hospital with Lt sided weakness, but the last day I was there, I developed weakness on my Rt side (Pt/mother encouraged to discuss with neurologist at next appt. and request another MRI)    Patient is accompained by: Family member  Mom   Pertinent History h/o CVA   Patient Stated Goals To get my arms working like they used to   Currently in Pain? Yes   Pain Location Neck   Pain Orientation Right;Left   Pain Descriptors / Indicators Radiating;Throbbing   Pain Type Acute pain   Pain Radiating Towards from shoulder blades to neck    Pain Onset 1 to 4 weeks ago   Pain Frequency Intermittent   Aggravating Factors  nothing   Pain Relieving Factors meds           [facility] - 02/15/16 0001    Assessment   Diagnosis CVA    Referring Provider Esperanza RichtersEdward Saguier PA-C   Onset Date 01/29/16   Assessment Pt arrives with Rt hemiparesis however reports when pt first admitted to hospital weakness was on Lt side   Prior Therapy none   Precautions   Precautions Fall   Restrictions   Weight Bearing Restrictions No   Balance Screen   Has the patient fallen in the past 6 months No   Has the patient had a decrease in activity level because of a fear of falling?  Yes   Is the patient reluctant to leave their home because of a fear of falling?  Yes   Home  Environment   Bathroom Shower/Tub Tub/Shower unit;Curtain   Home Equipment [place] - 2 wheels;Cane - single point;Bedside commode   Additional Comments Lives in 1 story home with 3-4 steps, landing then another step    Lives With Family;Spouse;Daughter   Prior Function   Level of Independence Independent with basic ADLs;Independent with household mobility without device   Vocation Unemployed   ADL   Eating/Feeding Modified independent  extra time and difficulty   Grooming Modified independent  with difficulty   Upper Body Bathing Maximal assistance   Lower Body  Bathing Maximal assistance   Upper Body Dressing Increased time;Needs assist for fasteners   Lower Body Dressing Increased time;Needs assist for fasteners   Toilet Tranfer Modified independent  with grab bar   Toileting - Clothing Manipulation Increased time;Modified independent   Tub/Shower Transfer Minimal assistance   ADL comments Dependent for all IADLS (Cooking and cleaning)    IADL   Medication Management Takes responsibility if medication is prepared in advance in seperate dosage;Has difficulty remembering to take medication   Financial Management --  wife always performed   Mobility   Mobility Status Needs assist  using FWW   Written Expression   Dominant Hand Right   Handwriting 75% legible  NAME ONLY (PRINT)    Vision - History   Baseline Vision Bifocals   Additional Comments Pt reports  poor eyesight and dryness prior to CVA. Increased wetness since CVA   Vision Assessment   Eye Alignment Within Functional Limits   Ocular Range of Motion Within Functional Limits   Tracking/Visual Pursuits Able to track stimulus in all quads without difficulty   Convergence Impaired (comment)  reports dizziness   Visual Fields No apparent deficits   Diplopia Assessment --  Denies    Cognition   Overall Cognitive Status Cognition to be further assessed in functional context PRN   Sensation   Light Touch Appears Intact   Hot/Cold Impaired by gross assessment  per pt report   Coordination   9 Hole Peg Test Right;Left   Right 9 Hole Peg Test 86.04 sec   Left 9 Hole Peg Test 25.50 sec   Box and Blocks Rt = 18, Lt = 45   Edema   Edema mild Rt hand   Tone   Assessment Location Right Upper Extremity   ROM / Strength   AROM / PROM / Strength AROM   AROM   Overall AROM Comments LUE WFL's but may have mild weakness. RUE: Shoulder flex/abd approx. 40*, ER 75% with difficulty, IR 90%. Elbow distally WFL's, 90% gross finger extension   Hand Function   Right Hand Grip (lbs) 38 lbs   Left Hand Grip (lbs) 113 lbs   RUE Tone   RUE Tone Mild                           OT Short Term Goals - 02/15/16 1357    OT SHORT TERM GOAL #1   Title Independent with initial HEP (All STG's due 03/17/16)    Time 4   Period Weeks   Status New   OT SHORT TERM GOAL #2   Title Pt to perform bathing with only min assist or less   Baseline max assist   Time 4   Period Weeks   Status New   OT SHORT TERM GOAL #3   Title Pt to consistently hook buttons, fastners, cut food, and tie shoes at Mod I level with A/E prn   Time 4   Period Weeks   Status New   OT SHORT TERM GOAL #4   Title Pt to verbalize understanding with pain management strategies for neck and RUE   Time 4   Period Weeks   Status New   OT SHORT TERM GOAL #5   Title Pt to improve RUE function as evidenced by performing 25  or greater on Box & Blocks test   Baseline eval = 18 (Lt = 45)    Time 4   Period Weeks  Status New           OT Long Term Goals - 02/15/16 1400    OT LONG TERM GOAL #1   Title Independent with updated HEP (All LTG's due 04/16/16)    Time 8   Period Weeks   Status New   OT LONG TERM GOAL #2   Title Improve coordination RUE as evidenced by performing 9 hole peg test in 50 sec. or less   Baseline eval = 86.04 sec (Lt = 25.50 sec)    Time 8   Period Weeks   Status New   OT LONG TERM GOAL #3   Title Improve grip strength Rt hand to 50 lbs or greater to assist with opening jars   Baseline eval = 38 lbs (Lt = 113 lbs)    Time 8   Period Weeks   Status New   OT LONG TERM GOAL #4   Title Pt to retrieve and replace lightweight object from cabinet RUE requiring 100* shoulder flexion or >   Baseline eval = approx 40* sh. flex   Time 8   Period Weeks   Status New   OT LONG TERM GOAL #5   Title Pt to perform simple meal prep/light cleaning activities Mod I level with DME prn   Time 8   Period Weeks   Status New               Plan - 02/15/16 1351    Clinical Impression Statement Pt is a 50 y.o. male who presents to outpatient rehab for moderately complex O.T. evaluation s/p CVA with Rt dominant side hemiplegia. Pt reports originally Lt sided weakness, however mostly resolved and new Rt sided weakness upon d/c from hospital. Pt with h/o CVA's in 2012. Pt now with increased pain, decreased ROM, strength and coordination affecting pt's ability to perform BADLS and IADLS.    Rehab Potential Good   OT Frequency 2x / week   OT Duration 8 weeks  PLUS EVALUATION   OT Treatment/Interventions Self-care/ADL training;Therapeutic exercise;Functional Mobility Training;Patient/family education;Neuromuscular education;Splinting;Manual Therapy;DME and/or AE instruction;Therapeutic activities;Electrical Stimulation;Parrafin;Cognitive remediation/compensation;Moist Heat;Passive range of  motion;Visual/perceptual remediation/compensation   Plan HEP for RUE ROM, coordination and grip strength   Recommended Other Services PT referral requested for Rt LE weakness and dizziness   Consulted and Agree with Plan of Care Patient;Family member/caregiver   Family Member Consulted Mom       Patient will benefit from skilled therapeutic intervention in order to improve the following deficits and impairments:  Decreased coordination, Decreased range of motion, Decreased endurance, Increased edema, Impaired sensation, Decreased knowledge of precautions, Decreased knowledge of use of DME, Impaired UE functional use, Pain, Decreased mobility, Decreased strength, Impaired vision/preception, Decreased cognition  Visit Diagnosis: Hemiplegia and hemiparesis following cerebral infarction affecting right dominant side (HCC) - Plan: Ot plan of care cert/re-cert  Pain In Right Arm - Plan: Ot plan of care cert/re-cert  Other lack of coordination - Plan: Ot plan of care cert/re-cert  Muscle weakness (generalized) - Plan: Ot plan of care cert/re-cert    Problem List Patient Active Problem List   Diagnosis Date Noted  . Visit for preventive health examination 01/20/2016  . Daytime somnolence 01/20/2016  . Prostate cancer screening 01/20/2016  . Depression 04/04/2015  . Essential hypertension, benign 04/04/2015  . Chronic fatigue 04/04/2015  . History of stroke 04/04/2015  . Low serum vitamin D 04/04/2015  . Low testosterone 04/04/2015    Antonio Ellison, Antonio Ellison, Antonio Ellison 02/15/2016, 2:06 PM  [facility] [address]  [address] [place], [place], [address] Phone: [phone]   Fax:  [phone]  Name: Antonio Ellison MRN: [id number] Date of Birth: Apr 23, 1966

## 2016-02-15 NOTE — Telephone Encounter (Signed)
Referal to pt placed.

## 2016-02-16 NOTE — Telephone Encounter (Signed)
Pt is scheduled with [facility] 03/26/16. I left msg notifying him of this.

## 2016-02-20 ENCOUNTER — Ambulatory Visit: Payer: PRIVATE HEALTH INSURANCE | Admitting: Occupational Therapy

## 2016-02-20 ENCOUNTER — Encounter: Payer: Self-pay | Admitting: Occupational Therapy

## 2016-02-20 DIAGNOSIS — R278 Other lack of coordination: Secondary | ICD-10-CM

## 2016-02-20 DIAGNOSIS — I69351 Hemiplegia and hemiparesis following cerebral infarction affecting right dominant side: Secondary | ICD-10-CM | POA: Diagnosis not present

## 2016-02-20 DIAGNOSIS — M6281 Muscle weakness (generalized): Secondary | ICD-10-CM

## 2016-02-20 NOTE — Therapy (Signed)
[facility] [address]  [address] [place], [place], [address] Phone: [phone]   Fax:  [phone]  Occupational Therapy Treatment  Patient Details  Name: Antonio Ellison MRN: [id number] Date of Birth: Jul 03, 1966 Referring Provider: Esperanza RichtersEdward Saguier PA-C  Encounter Date: 02/20/2016      OT End of Session - 02/20/16 1048    Visit Number 2   Number of Visits 17   Date for OT Re-Evaluation 04/16/16   Authorization Type [employer]   Authorization Time Period 60 visit limit (PT referral requested) - WEEK 1/8   Authorization - Visit Number 2   Authorization - Number of Visits 30   OT Start Time 0845   OT Stop Time 0930   OT Time Calculation (min) 45 min   Activity Tolerance Patient tolerated treatment well      Past Medical History  Diagnosis Date  . Stroke (HCC) 2012  . History of chicken pox     6617 Mths old  . Hypertension   . Hyperlipidemia   . Low testosterone   . Asthma     Past Surgical History  Procedure Laterality Date  . Achilles tendon repair      Left Leg    There were no vitals filed for this visit.      Subjective Assessment - 02/20/16 0849    Subjective  I am doing a little better and no pain today   Pertinent History h/o CVA   Patient Stated Goals To get my arms working like they used to   Currently in Pain? No/denies                      OT Treatments/Exercises (OP) - 02/20/16 0001    Exercises   Exercises Shoulder;Hand   Shoulder Exercises: ROM/Strengthening   Other ROM/Strengthening Exercises Cane ex's supine and seated x 10 reps each for neuro re-education of RUE and min cues to perform correctly - see pt instructions   Hand Exercises   Theraputty --  see pt instructions - pt issued red putty   Fine Motor Coordination   Other Fine Motor Exercises [facility] and return demo of each - see pt instructions for details                OT Education - 02/20/16  0929    Education provided Yes   Education Details Coordination, putty and initial RUE ROM HEP   Person(s) Educated Patient   Methods Explanation;Demonstration;Verbal cues   Comprehension Verbalized understanding;Returned demonstration          OT Short Term Goals - 02/15/16 1357    OT SHORT TERM GOAL #1   Title Independent with initial HEP (All STG's due 03/17/16)    Time 4   Period Weeks   Status New   OT SHORT TERM GOAL #2   Title Pt to perform bathing with only min assist or less   Baseline max assist   Time 4   Period Weeks   Status New   OT SHORT TERM GOAL #3   Title Pt to consistently hook buttons, fastners, cut food, and tie shoes at Mod I level with A/E prn   Time 4   Period Weeks   Status New   OT SHORT TERM GOAL #4   Title Pt to verbalize understanding with pain management strategies for neck and RUE   Time 4   Period Weeks   Status New  OT SHORT TERM GOAL #5   Title Pt to improve RUE function as evidenced by performing 25 or greater on Box & Blocks test   Baseline eval = 18 (Lt = 45)    Time 4   Period Weeks   Status New           OT Long Term Goals - 02/15/16 1400    OT LONG TERM GOAL #1   Title Independent with updated HEP (All LTG's due 04/16/16)    Time 8   Period Weeks   Status New   OT LONG TERM GOAL #2   Title Improve coordination RUE as evidenced by performing 9 hole peg test in 50 sec. or less   Baseline eval = 86.04 sec (Lt = 25.50 sec)    Time 8   Period Weeks   Status New   OT LONG TERM GOAL #3   Title Improve grip strength Rt hand to 50 lbs or greater to assist with opening jars   Baseline eval = 38 lbs (Lt = 113 lbs)    Time 8   Period Weeks   Status New   OT LONG TERM GOAL #4   Title Pt to retrieve and replace lightweight object from cabinet RUE requiring 100* shoulder flexion or >   Baseline eval = approx 40* sh. flex   Time 8   Period Weeks   Status New   OT LONG TERM GOAL #5   Title Pt to perform simple meal prep/light  cleaning activities Mod I level with DME prn   Time 8   Period Weeks   Status New               Plan - 02/20/16 1049    Clinical Impression Statement Pt is progressing with function and decreased pain  (no pain today). Pt appears very motivated to get better and open to all recommended ex's.    OT Frequency 2x / week   OT Duration 8 weeks   OT Treatment/Interventions Self-care/ADL training;Therapeutic exercise;Functional Mobility Training;Patient/family education;Neuromuscular education;Splinting;Manual Therapy;DME and/or AE instruction;Therapeutic activities;Electrical Stimulation;Parrafin;Cognitive remediation/compensation;Moist Heat;Passive range of motion;Visual/perceptual remediation/compensation   Plan review HEP issued today, practice hooking buttons/fastners, simulate tub transfer   Consulted and Agree with Plan of Care Patient      Patient will benefit from skilled therapeutic intervention in order to improve the following deficits and impairments:  Decreased coordination, Decreased range of motion, Decreased endurance, Increased edema, Impaired sensation, Decreased knowledge of precautions, Decreased knowledge of use of DME, Impaired UE functional use, Pain, Decreased mobility, Decreased strength, Impaired vision/preception, Decreased cognition  Visit Diagnosis: Hemiplegia and hemiparesis following cerebral infarction affecting right dominant side (HCC)  Other lack of coordination  Muscle weakness (generalized)    Problem List Patient Active Problem List   Diagnosis Date Noted  . Visit for preventive health examination 01/20/2016  . Daytime somnolence 01/20/2016  . Prostate cancer screening 01/20/2016  . Depression 04/04/2015  . Essential hypertension, benign 04/04/2015  . Chronic fatigue 04/04/2015  . History of stroke 04/04/2015  . Low serum vitamin D 04/04/2015  . Low testosterone 04/04/2015    Kelli Churn, OTR/L 02/20/2016, 10:55 AM  [facility] [address]  [address] [place], [place], [address] Phone: [phone]   Fax:  [phone]  Name: Antonio Ellison MRN: [id number] Date of Birth: [birth year]

## 2016-02-20 NOTE — Patient Instructions (Signed)
1. Grip Strengthening (Resistive Putty)   Squeeze putty using thumb and all fingers. Repeat _20___ times. Do __2__ sessions per day.   2. Roll putty into tube on table and pinch between each finger and thumb x 10 reps each. (can do ring and small finger together)      Coordination Activities  Perform the following activities for 15-20 minutes 1-2 times per day with right hand(s).   Rotate ball in fingertips (clockwise and counter-clockwise). Use firm ball  Toss ball in air and catch with the same hand. Use hacky sack style ball with weight to it  Flip cards 1 at a time as fast as you can.  Deal cards with your thumb (Hold deck in hand and push card off top with thumb).  Rotate card in hand (clockwise and counter-clockwise).  Pick up coins one at a time until you get 5-10 in your hand, then move coins from palm to fingertips to stack one at a time.  Practice writing and/or typing.   ROM: Flexion - Wand (Supine)    Lie on back holding wand. Raise arms over head. Keep elbows STRAIGHT! Repeat __10__ times per set.  Do __2_ sessions per day.  SHOULDER: Flexion - Sitting    Hold cane with both hands. Raise arms up. Keep elbows straight. Hold _2-3__ seconds. _10__ reps per set, _2__ sets per day   ROM: Abduction - Wand    Holding wand with left hand palm up, push wand directly out to side, leading with other hand palm down, until stretch is felt. Hold __2-3__ seconds. Repeat __10__ times per set. Do __2__ sessions per day.

## 2016-02-22 ENCOUNTER — Ambulatory Visit: Payer: PRIVATE HEALTH INSURANCE | Admitting: Occupational Therapy

## 2016-02-22 DIAGNOSIS — I69351 Hemiplegia and hemiparesis following cerebral infarction affecting right dominant side: Secondary | ICD-10-CM | POA: Diagnosis not present

## 2016-02-22 DIAGNOSIS — M6281 Muscle weakness (generalized): Secondary | ICD-10-CM

## 2016-02-22 DIAGNOSIS — R278 Other lack of coordination: Secondary | ICD-10-CM

## 2016-02-22 NOTE — Therapy (Signed)
[facility] [address]  [address] [place], [place], [address] Phone: [phone]   Fax:  [phone]  Occupational Therapy Treatment  Patient Details  Name: Antonio Ellison MRN: [id number] Date of Birth: Apr 13, 1966 Referring Provider: Esperanza Richters PA-C  Encounter Date: 02/22/2016      OT End of Session - 02/22/16 1334    Visit Number 3   Number of Visits 17   Date for OT Re-Evaluation 04/16/16   Authorization Type [employer]   Authorization Time Period 60 visit limit (PT referral requested) - WEEK 1/8   Authorization - Visit Number 3   Authorization - Number of Visits 30   OT Start Time 1235   OT Stop Time 1320   OT Time Calculation (min) 45 min   Activity Tolerance Patient tolerated treatment well      Past Medical History  Diagnosis Date  . Stroke (HCC) 2012  . History of chicken pox     87 Mths old  . Hypertension   . Hyperlipidemia   . Low testosterone   . Asthma     Past Surgical History  Procedure Laterality Date  . Achilles tendon repair      Left Leg    There were no vitals filed for this visit.      Subjective Assessment - 02/22/16 1238    Patient is accompained by: Family member  mom   Pertinent History h/o CVA   Patient Stated Goals To get my arms working like they used to   Currently in Pain? No/denies                      OT Treatments/Exercises (OP) - 02/22/16 0001    ADLs   UB Dressing Practiced donning/doffing shirt with hemi-techniques (donning Rt side first, removing Rt side last). Pt also practiced hooking/unhooking buttons with difficulty but I'ly.    LB Dressing Practiced tying Rt shoe with difficulty but I'ly. Also instructed in hemi techniques for LB dressing and safety for balance. Advised pt to don all LE garments at once to knees prior to standing.    Hand Exercises   Other Hand Exercises Reviewed putty HEP verbally with patient/mother   Fine Motor Coordination    Other Fine Motor Exercises Reviewed FMC HEP - pt demo each x 5 reps   Neurological Re-education Exercises   Other Exercises 1 Performed cane ex's seated to mid level in shoulder flexion bilaterally, and sh. abduction to Rt side with assist from LUE. Pt cued on correct positioning for posture, elbows extended, and to perform abd. in slight diagonal (scaption) vs. straight out to side. Pt then perform high level bilateral sh. flexion with cane in supine with cues for control during high level range. (Pt can perform all these at home from HEP)    Other Exercises 2 Supine: progressed to high level sh. flexion with ball for increased control and neutral sh. and forearm rotation - pt with min tactile cues/facilitation required for proper alignment and control. Pt instructed not to perform at home yet d/t requiring guidance from therapist to perform correctly. Pt then progressed to standing facing wall to perform AA/ROM in bilateral high level sh. flexion (wall slides) with close supervision for balance, and cues to prevent sh. hiking, sh. IR, and pelvic control. Pt instructed to do this at home only with direct supervision and cueing from mother  (mother present during entire session for education on positioning)  OT Education - 02/22/16 1333    Education provided Yes   Education Details Review of coordination, putty and NMR HEP, safety techniques/fall prevention, hemi techniques for dressing, walker safety with Rt hand placement   Person(s) Educated Patient;Parent(s)   Methods Explanation;Demonstration   Comprehension Verbalized understanding          OT Short Term Goals - 02/15/16 1357    OT SHORT TERM GOAL #1   Title Independent with initial HEP (All STG's due 03/17/16)    Time 4   Period Weeks   Status New   OT SHORT TERM GOAL #2   Title Pt to perform bathing with only min assist or less   Baseline max assist   Time 4   Period Weeks   Status New   OT SHORT TERM  GOAL #3   Title Pt to consistently hook buttons, fastners, cut food, and tie shoes at Mod I level with A/E prn   Time 4   Period Weeks   Status New   OT SHORT TERM GOAL #4   Title Pt to verbalize understanding with pain management strategies for neck and RUE   Time 4   Period Weeks   Status New   OT SHORT TERM GOAL #5   Title Pt to improve RUE function as evidenced by performing 25 or greater on Box & Blocks test   Baseline eval = 18 (Lt = 45)    Time 4   Period Weeks   Status New           OT Long Term Goals - 02/15/16 1400    OT LONG TERM GOAL #1   Title Independent with updated HEP (All LTG's due 04/16/16)    Time 8   Period Weeks   Status New   OT LONG TERM GOAL #2   Title Improve coordination RUE as evidenced by performing 9 hole peg test in 50 sec. or less   Baseline eval = 86.04 sec (Lt = 25.50 sec)    Time 8   Period Weeks   Status New   OT LONG TERM GOAL #3   Title Improve grip strength Rt hand to 50 lbs or greater to assist with opening jars   Baseline eval = 38 lbs (Lt = 113 lbs)    Time 8   Period Weeks   Status New   OT LONG TERM GOAL #4   Title Pt to retrieve and replace lightweight object from cabinet RUE requiring 100* shoulder flexion or >   Baseline eval = approx 40* sh. flex   Time 8   Period Weeks   Status New   OT LONG TERM GOAL #5   Title Pt to perform simple meal prep/light cleaning activities Mod I level with DME prn   Time 8   Period Weeks   Status New               Plan - 02/22/16 1334    Clinical Impression Statement Pt with increased awareness into proper positioning of RUE during ex's. Pt with slightly increased use and range of RUE with no pain today. Pt still demo decreased awareness of Rt hand with walker placement and decreased awareness into balance deficits.    OT Frequency 2x / week   OT Duration 8 weeks   OT Treatment/Interventions Self-care/ADL training;Therapeutic exercise;Functional Mobility Training;Patient/family  education;Neuromuscular education;Splinting;Manual Therapy;DME and/or AE instruction;Therapeutic activities;Electrical Stimulation;Parrafin;Cognitive remediation/compensation;Moist Heat;Passive range of motion;Visual/perceptual remediation/compensation   Plan continue NMR, coordination, simulate tub  transfer   Consulted and Agree with Plan of Care Patient;Family member/caregiver   Family Member Consulted Mom       Patient will benefit from skilled therapeutic intervention in order to improve the following deficits and impairments:  Decreased coordination, Decreased range of motion, Decreased endurance, Increased edema, Impaired sensation, Decreased knowledge of precautions, Decreased knowledge of use of DME, Impaired UE functional use, Pain, Decreased mobility, Decreased strength, Impaired vision/preception, Decreased cognition  Visit Diagnosis: Hemiplegia and hemiparesis following cerebral infarction affecting right dominant side (HCC)  Other lack of coordination  Muscle weakness (generalized)    Problem List Patient Active Problem List   Diagnosis Date Noted  . Visit for preventive health examination 01/20/2016  . Daytime somnolence 01/20/2016  . Prostate cancer screening 01/20/2016  . Depression 04/04/2015  . Essential hypertension, benign 04/04/2015  . Chronic fatigue 04/04/2015  . History of stroke 04/04/2015  . Low serum vitamin D 04/04/2015  . Low testosterone 04/04/2015    Kelli ChurnBallie, Deshanna Kama Johnson, OTR/L 02/22/2016, 1:38 PM  [facility] [address]  [address] [place], [place], [address] Phone: [phone]   Fax:  [phone]  Name: Murvin Natalric Kirkeby MRN: [id number] Date of Birth: 1965/09/03

## 2016-02-23 ENCOUNTER — Ambulatory Visit: Payer: PRIVATE HEALTH INSURANCE | Admitting: Physical Therapy

## 2016-02-23 DIAGNOSIS — M6281 Muscle weakness (generalized): Secondary | ICD-10-CM

## 2016-02-23 DIAGNOSIS — R278 Other lack of coordination: Secondary | ICD-10-CM

## 2016-02-23 DIAGNOSIS — M545 Low back pain, unspecified: Secondary | ICD-10-CM

## 2016-02-23 DIAGNOSIS — I69351 Hemiplegia and hemiparesis following cerebral infarction affecting right dominant side: Secondary | ICD-10-CM | POA: Diagnosis not present

## 2016-02-23 NOTE — Therapy (Signed)
[facility] [address]  [address] [place], [place], [address] Phone: [phone]   Fax:  [phone]  Physical Therapy Evaluation  Patient Details  Name: Antonio Ellison MRN: [id number] Date of Birth: 01-03-1966 Referring Provider: Esperanza RichtersEdward Saguier, PA-C  Encounter Date: 02/23/2016      PT End of Session - 02/23/16 1301    Visit Number 1   Number of Visits 16   Date for PT Re-Evaluation 04/19/16   PT Start Time 1100   PT Stop Time 1152   PT Time Calculation (min) 52 min   Activity Tolerance Patient tolerated treatment well;Patient limited by fatigue   Behavior During Therapy [facility] for tasks assessed/performed      Past Medical History  Diagnosis Date  . Stroke (HCC) 2012  . History of chicken pox     8817 Mths old  . Hypertension   . Hyperlipidemia   . Low testosterone   . Asthma     Past Surgical History  Procedure Laterality Date  . Achilles tendon repair      Left Leg    There were no vitals filed for this visit.       Subjective Assessment - 02/23/16 1106    Subjective Pt reports he had his stroke on Father's Day, first noticing symptoms with difficulty walking while on a walk with his wife. Notes continued weakness on R side with limited endurance and balance necessitating use of a RW for ambulation due to weakness and fatigue. Also notes R sided low back pain when attmepting to complete standing LE exercises at RW.   Patient is accompained by: Family member  mother   Pertinent History CVA 01/29/16   Patient Stated Goals "walk w/o a walker"   Currently in Pain? No/denies   Pain Score --  up to 5/10 when completing self-created standing exercise program   Pain Location Back   Pain Orientation Right   Pain Descriptors / Indicators Aching;Throbbing   Pain Type Acute pain   Pain Onset 1 to 4 weeks ago   Pain Frequency Intermittent   Aggravating Factors  Standing LE exercises   Pain Relieving Factors rest   Effect of Pain on Daily Activities limited tolerance for standing exercises            [facility] PT Assessment - 02/23/16 1100    Assessment   Medical Diagnosis CVA - R hemiparesis   Referring Provider Esperanza RichtersEdward Saguier, PA-C   Onset Date/Surgical Date 01/29/16   Hand Dominance Right   Next MD Visit none scheduled   Prior Therapy none except during acute care stay   Precautions   Precautions Fall   Balance Screen   Has the patient fallen in the past 6 months Yes   How many times? 2  pt stumbled 2x upon rising from chair, falling into TV   Has the patient had a decrease in activity level because of a fear of falling?  Yes   Is the patient reluctant to leave their home because of a fear of falling?  No   Home [employer] information [employer] managernvironment   Living Environment Private residence   Living Arrangements Spouse/significant other   Available Help at Discharge Family   Type of Home House   Home Access Stairs to enter   Entrance Stairs-Number of Steps 5+1   Entrance Stairs-Rails Right   Home Layout One level   Home Equipment Walker - 2 wheels;Bedside commode;Cane - single point   Prior Function  Level of Independence Independent   Vocation Unemployed   Leisure Mostly sedentary   Observation/Other Assessments   Focus on Therapeutic Outcomes (FOTO)  Neuromuscular disorder 34% (66% limitation); predicted 62% (38% limitation)   Sensation   Light Touch Appears Intact   ROM / Strength   AROM / PROM / Strength AROM;Strength   Strength   Strength Assessment Site Hip;Knee;Ankle   Right/Left Hip Right;Left   Right Hip Flexion 3-/5   Right Hip Extension 3/5   Right Hip ABduction 3-/5   Right Hip ADduction 3-/5   Left Hip Flexion 5/5   Left Hip Extension 5/5   Left Hip ABduction 5/5   Left Hip ADduction 5/5   Right/Left Knee Right;Left   Right Knee Flexion 3+/5   Right Knee Extension 4/5   Left Knee Flexion 5/5   Left Knee Extension 5/5   Right/Left Ankle Right;Left   Right Ankle Dorsiflexion 4-/5    Right Ankle Plantar Flexion 4/5   Right Ankle Inversion 3/5   Right Ankle Eversion 3/5   Left Ankle Dorsiflexion 5/5   Left Ankle Plantar Flexion 5/5   Left Ankle Inversion 5/5   Left Ankle Eversion 5/5   Ambulation/Gait   Assistive device Rolling walker;None   Gait Pattern Decreased step length - right;Decreased stance time - right;Decreased hip/knee flexion - right;Decreased dorsiflexion - right;Decreased weight shift to right;Poor foot clearance - right   Gait velocity 2.42 ft/sec   w/o AD   Standardized Balance Assessment   Standardized Balance Assessment Berg Balance Test;Timed Up and Go Test;Five Times Sit to Stand;10 meter walk test   Five times sit to stand comments  9.69"   10 Meter Walk 2.42 ft/sec  13.53 sec w/o AD   Berg Balance Test   Sit to Stand Able to stand  independently using hands   Standing Unsupported Able to stand safely 2 minutes   Sitting with Back Unsupported but Feet Supported on Floor or Stool Able to sit safely and securely 2 minutes   Stand to Sit Controls descent by using hands   Transfers Able to transfer safely, minor use of hands   Standing Unsupported with Eyes Closed Able to stand 10 seconds safely   Standing Ubsupported with Feet Together Able to place feet together independently and stand 1 minute safely   From Standing, Reach Forward with Outstretched Arm Can reach forward >5 cm safely (2")   From Standing Position, Pick up Object from Floor Able to pick up shoe safely and easily   From Standing Position, Turn to Look Behind Over each Shoulder Looks behind one side only/other side shows less weight shift   Turn 360 Degrees Needs close supervision or verbal cueing   Standing Unsupported, Alternately Place Feet on Step/Stool Needs assistance to keep from falling or unable to try   Standing Unsupported, One Foot in [employer] balance while stepping or standing   Standing on One Leg Able to lift leg independently and hold 5-10 seconds   Total Score  39   Berg comment: 37-45 significant fall risk (>80%)   Timed Up and Go Test   TUG Normal TUG   Normal TUG (seconds) 11.63  w/o AD               PT Education - 02/23/16 1150    Education provided Yes   Education Details PT eval findings and POC   Person(s) Educated Patient;Parent(s)   Methods Explanation   Comprehension Verbalized understanding  PT Short Term Goals - 02/23/16 1150    PT SHORT TERM GOAL #1   Title Independent with initial LE strengthening HEP by 03/23/16   Status New   PT SHORT TERM GOAL #2   Title R LE strength grossly 3+/5 to 4-/5 for improved balance and gait stability by 03/23/16   Status New   PT SHORT TERM GOAL #3   Title Pt will safely ambulate on level surfaces with SPC demonstrating adequate R foot clearance to reduce risk for falls by 03/23/16   Status New   PT SHORT TERM GOAL #4   Title Pt will demonstrate reduced risk for falls as evidenced by Sharlene Motts Balance Scale score >/= 45/56 by 03/23/16   Status New           PT Long Term Goals - 02/23/16 1151    PT LONG TERM GOAL #1   Title Independent with advanced HEP as indicated by 04/19/16   Status New   PT LONG TERM GOAL #2   Title R LE strength >/= 4/5 for improved balance and gait stability by 04/19/16   Status New   PT LONG TERM GOAL #3   Title Pt will safely ambulate on level surfaces without AD demonstrating normal gait pattern to reduce risk for falls by 04/19/16   Status New   PT LONG TERM GOAL #4   Title Pt will demonstrate low risk for falls as evidenced by Sharlene Motts Balance Scale score >/= 52/56 by 04/19/16   Status New               Plan - 02/23/16 1150    Clinical Impression Statement Antonio Ellison is a 50 y/o male who presents to OP PT for a moderately complex eval s/p CVA on 01/29/16 resulting in R dominant side hemiparesis. Pt presents to PT using RW for ambulation due to moderate R LE weakness, greatest proximally at hip and in lateral ankle movements (refer to above MMT),  resulting in decreased hip and knee flexion with inconsistent foot clearance. Gait deviations and decreased gait speed (2.42 ft/sec) along with impaired balance as indicated by Sharlene Motts Balance scale score of 39/56 create a significant risk for falls (>80%). Pt will benefit from skilled PT with POC to focus on core/R LE strengthening to improve balance and stability, along with balance and dynamic gait activities to decrease risk for falls. Modalities to be incorporated as needed for pain management as pt also reporting intermittent R sided LBP.   Rehab Potential Good   Clinical Impairments Affecting Rehab Potential prior CVA's in 2012, intermittent R sided LBP   PT Frequency 2x / week   PT Duration 8 weeks   PT Treatment/Interventions Patient/family education;Neuromuscular re-education;Balance training;Therapeutic exercise;Therapeutic activities;Functional mobility training;Gait training;Stair training;Manual techniques;Taping;Electrical Stimulation;Moist Heat;ADLs/Self Care Home Management   PT Next Visit Plan Create initial LE strengthening HEP   Consulted and Agree with Plan of Care Patient;Family member/caregiver   Family Member Consulted mother      Patient will benefit from skilled therapeutic intervention in order to improve the following deficits and impairments:  Decreased strength, Decreased coordination, Decreased balance, Impaired tone, Difficulty walking, Abnormal gait, Pain  Visit Diagnosis: Hemiplegia and hemiparesis following cerebral infarction affecting right dominant side (HCC) - Plan: PT plan of care cert/re-cert  Other lack of coordination - Plan: PT plan of care cert/re-cert  Muscle weakness (generalized) - Plan: PT plan of care cert/re-cert  Right-sided low back pain without sciatica - Plan: PT plan of care cert/re-cert  Problem List Patient Active Problem List   Diagnosis Date Noted  . Visit for preventive health examination 01/20/2016  . Daytime somnolence  01/20/2016  . Prostate cancer screening 01/20/2016  . Depression 04/04/2015  . Essential hypertension, benign 04/04/2015  . Chronic fatigue 04/04/2015  . History of stroke 04/04/2015  . Low serum vitamin D 04/04/2015  . Low testosterone 04/04/2015    Marry Guan, PT, MPT 02/23/2016, 8:50 PM  [facility] [address]  [address] [place], [place], [address] Phone: [phone]   Fax:  [phone]  Name: Antonio Ellison MRN: [id number] Date of Birth: 04/03/1966

## 2016-02-28 ENCOUNTER — Ambulatory Visit: Payer: PRIVATE HEALTH INSURANCE | Admitting: Physical Therapy

## 2016-02-28 DIAGNOSIS — M545 Low back pain, unspecified: Secondary | ICD-10-CM

## 2016-02-28 DIAGNOSIS — I69351 Hemiplegia and hemiparesis following cerebral infarction affecting right dominant side: Secondary | ICD-10-CM

## 2016-02-28 DIAGNOSIS — M6281 Muscle weakness (generalized): Secondary | ICD-10-CM

## 2016-02-28 DIAGNOSIS — R278 Other lack of coordination: Secondary | ICD-10-CM

## 2016-02-28 NOTE — Therapy (Signed)
[facility] [address]  [address] [place], [place], [address] Phone: [phone]   Fax:  [phone]  Physical Therapy Treatment  Patient Details  Name: Antonio Ellison MRN: [id number] Date of Birth: 05/03/1966 Referring Provider: Esperanza RichtersEdward Saguier, PA-C  Encounter Date: 02/28/2016      PT End of Session - 02/28/16 1105    Visit Number 2   Number of Visits 16   Date for PT Re-Evaluation 04/19/16   PT Start Time 1105   PT Stop Time 1154   PT Time Calculation (min) 49 min   Activity Tolerance Patient tolerated treatment well;Patient limited by lethargy   Behavior During Therapy [facility] for tasks assessed/performed      Past Medical History  Diagnosis Date  . Stroke (HCC) 2012  . History of chicken pox     5717 Mths old  . Hypertension   . Hyperlipidemia   . Low testosterone   . Asthma     Past Surgical History  Procedure Laterality Date  . Achilles tendon repair      Left Leg    There were no vitals filed for this visit.      Subjective Assessment - 02/28/16 1110    Subjective Pt denies pain today but does note some tingling in R hand, esp in thumb and 2nd & 3rd digits. States this becomes more pronounced when sqeezing the HEP clay provided by the OT. Pt states he stays tired most of the day and is looking for things to keep him active.   Patient Stated Goals "walk w/o a walker"   Currently in Pain? No/denies         Today's Treatment  TherEx NuStep - lvl 5 x 5' R SLR (attempted but limited control therefore deferred at this time) Hooklying March x10  (HEP) Bridge 10x5"  (HEP) Hooklying Alternating Hip ABD/ER with green TB 10x3"  (HEP) L Sidelying R clam with green TB 10x3"  (HEP) Seated Alternating Hip ABD/ER with green TB 10x3" (pt demos substitution with trunk rotation) Seated March with green TB 10x3"  (HEP) Bridge + B Hip ADD isometric ball squeeze 10x3"  (HEP) Standing at countertop:   Squat 10x3"   (HEP)   Alternating Hip ABD 10x3"   Alternating Hip Extension 10x3"          PT Education - 02/28/16 1202    Education provided Yes   Education Details Initial PT HEP   Person(s) Educated Patient   Methods Explanation;Demonstration;Verbal cues   Comprehension Verbalized understanding;Returned demonstration;Need further instruction          PT Short Term Goals - 02/28/16 1155    PT SHORT TERM GOAL #1   Title Independent with initial LE strengthening HEP by 03/23/16   Status On-going   PT SHORT TERM GOAL #2   Title R LE strength grossly 3+/5 to 4-/5 for improved balance and gait stability by 03/23/16   Status On-going   PT SHORT TERM GOAL #3   Title Pt will safely ambulate on level surfaces with SPC demonstrating adequate R foot clearance to reduce risk for falls by 03/23/16   Status On-going   PT SHORT TERM GOAL #4   Title Pt will demonstrate reduced risk for falls as evidenced by Sharlene MottsBerg Balance Scale score >/= 45/56 by 03/23/16   Status On-going           PT Long Term Goals - 02/28/16 1155    PT LONG TERM GOAL #1  Title Independent with advanced HEP as indicated by 04/19/16   Status On-going   PT LONG TERM GOAL #2   Title R LE strength >/= 4/5 for improved balance and gait stability by 04/19/16   Status On-going   PT LONG TERM GOAL #3   Title Pt will safely ambulate on level surfaces without AD demonstrating normal gait pattern to reduce risk for falls by 04/19/16   Status On-going   PT LONG TERM GOAL #4   Title Pt will demonstrate low risk for falls as evidenced by Sharlene Motts Balance Scale score >/= 52/56 by 04/19/16   Status On-going               Plan - 02/28/16 1206    Clinical Impression Statement Pt arrives to PT very lethargic today and states this is a normal state for him. Initiated LE strengthening program and created initial HEP, but will likely require further training to ensure good carryover as pt remained very lerthargic and required increased concentration  to complete exercises today.   PT Treatment/Interventions Patient/family education;Neuromuscular re-education;Balance training;Therapeutic exercise;Therapeutic activities;Functional mobility training;Gait training;Stair training;Manual techniques;Taping;Electrical Stimulation;Moist Heat;ADLs/Self Care Home Management   PT Next Visit Plan Review initial LE strengthening HEP; R LE strengthing progressing to standing and CKC exercises as tolerated; Standing balance/coordination activities; Gait training working toward weaning AD      Patient will benefit from skilled therapeutic intervention in order to improve the following deficits and impairments:  Decreased strength, Decreased coordination, Decreased balance, Impaired tone, Difficulty walking, Abnormal gait, Pain  Visit Diagnosis: Hemiplegia and hemiparesis following cerebral infarction affecting right dominant side (HCC)  Other lack of coordination  Muscle weakness (generalized)  Right-sided low back pain without sciatica     Problem List Patient Active Problem List   Diagnosis Date Noted  . Visit for preventive health examination 01/20/2016  . Daytime somnolence 01/20/2016  . Prostate cancer screening 01/20/2016  . Depression 04/04/2015  . Essential hypertension, benign 04/04/2015  . Chronic fatigue 04/04/2015  . History of stroke 04/04/2015  . Low serum vitamin D 04/04/2015  . Low testosterone 04/04/2015    Marry Guan, PT, MPT 02/28/2016, 12:14 PM  [facility] [address]  [address] [place], [place], [address] Phone: [phone]   Fax:  [phone]  Name: Antonio Ellison MRN: [id number] Date of Birth: 1966-06-29

## 2016-03-01 ENCOUNTER — Ambulatory Visit: Payer: PRIVATE HEALTH INSURANCE

## 2016-03-01 DIAGNOSIS — I69351 Hemiplegia and hemiparesis following cerebral infarction affecting right dominant side: Secondary | ICD-10-CM

## 2016-03-01 DIAGNOSIS — R278 Other lack of coordination: Secondary | ICD-10-CM

## 2016-03-01 DIAGNOSIS — M6281 Muscle weakness (generalized): Secondary | ICD-10-CM

## 2016-03-01 NOTE — Therapy (Signed)
[facility] [address]  [address] [place], [place], [address] Phone: [phone]   Fax:  [phone]  Physical Therapy Treatment  Patient Details  Name: Antonio Ellison MRN: [id number] Date of Birth: Sep 30, 1965 Referring Provider: Esperanza Richters, PA-C  Encounter Date: 03/01/2016      PT End of Session - 03/01/16 1320    Visit Number 3   Number of Visits 16   Date for PT Re-Evaluation 04/19/16   PT Start Time 1315   PT Stop Time 1355   PT Time Calculation (min) 40 min   Activity Tolerance Patient tolerated treatment well;Patient limited by lethargy   Behavior During Therapy [facility] for tasks assessed/performed      Past Medical History  Diagnosis Date  . Stroke (HCC) 2012  . History of chicken pox     76 Mths old  . Hypertension   . Hyperlipidemia   . Low testosterone   . Asthma     Past Surgical History  Procedure Laterality Date  . Achilles tendon repair      Left Leg    There were no vitals filed for this visit.      Subjective Assessment - 03/01/16 1319    Subjective Pt. reports his R hand tingling is better today and he has more energy than he did last treatment.    Patient Stated Goals "walk w/o a walker"   Multiple Pain Sites No      Today's treatment:  Therex: NuStep: level 5, 5 min   HEP Review: Hooklying bridge x 15 reps Hooklying bridge with adduction ball squeeze x 15 reps  Hooklying LE march with green TB around knees x 10 reps each  Hooklying alternating hip abd/ER with green TB x 10 reps  B sidelying clam shell with green TB x 10 reps Seated LE march with green TB around knees x 10 reps each side   * pt. with good overall technique however required VC's throughout to avoid holding breath  Neuro re-ed:  Sit<> standing with blue foam airex pad under L foot 2 x 10 reps Standing alternating step onto blue airex pad with cross over reach to red cone x 10 reps each side; CGA from  therapist Standing alternating toe-touch onto 8" step x 10 reps each; no UE support; CGA from therapist        PT Short Term Goals - 02/28/16 1155    PT SHORT TERM GOAL #1   Title Independent with initial LE strengthening HEP by 03/23/16   Status On-going   PT SHORT TERM GOAL #2   Title R LE strength grossly 3+/5 to 4-/5 for improved balance and gait stability by 03/23/16   Status On-going   PT SHORT TERM GOAL #3   Title Pt will safely ambulate on level surfaces with SPC demonstrating adequate R foot clearance to reduce risk for falls by 03/23/16   Status On-going   PT SHORT TERM GOAL #4   Title Pt will demonstrate reduced risk for falls as evidenced by Sharlene Motts Balance Scale score >/= 45/56 by 03/23/16   Status On-going           PT Long Term Goals - 02/28/16 1155    PT LONG TERM GOAL #1   Title Independent with advanced HEP as indicated by 04/19/16   Status On-going   PT LONG TERM GOAL #2   Title R LE strength >/= 4/5 for improved balance and gait stability by 04/19/16  Status On-going   PT LONG TERM GOAL #3   Title Pt will safely ambulate on level surfaces without AD demonstrating normal gait pattern to reduce risk for falls by 04/19/16   Status On-going   PT LONG TERM GOAL #4   Title Pt will demonstrate low risk for falls as evidenced by Sharlene MottsBerg Balance Scale score >/= 52/56 by 04/19/16   Status On-going               Plan - 03/01/16 1321    Clinical Impression Statement Pt. reports his R hand tingling is better today and he has more energy than he did last treatment. Today's treatment focused on standing/reaching activities to promote facilitation of R LE and UE.  Pt. requires close CGA from therapist for all standing activity due to frequent stumbling LOB.     PT Treatment/Interventions Patient/family education;Neuromuscular re-education;Balance training;Therapeutic exercise;Therapeutic activities;Functional mobility training;Gait training;Stair training;Manual  techniques;Taping;Electrical Stimulation;Moist Heat;ADLs/Self Care Home Management   PT Next Visit Plan Review initial LE strengthening HEP; R LE strengthing progressing to standing and CKC exercises as tolerated; Standing balance/coordination activities; Gait training working toward weaning AD      Patient will benefit from skilled therapeutic intervention in order to improve the following deficits and impairments:  Decreased strength, Decreased coordination, Decreased balance, Impaired tone, Difficulty walking, Abnormal gait, Pain  Visit Diagnosis: Hemiplegia and hemiparesis following cerebral infarction affecting right dominant side (HCC)  Other lack of coordination  Muscle weakness (generalized)     Problem List Patient Active Problem List   Diagnosis Date Noted  . Visit for preventive health examination 01/20/2016  . Daytime somnolence 01/20/2016  . Prostate cancer screening 01/20/2016  . Depression 04/04/2015  . Essential hypertension, benign 04/04/2015  . Chronic fatigue 04/04/2015  . History of stroke 04/04/2015  . Low serum vitamin D 04/04/2015  . Low testosterone 04/04/2015    Kermit BaloMicah Adetokunbo Mccadden, PTA 03/01/2016, 6:35 PM  [facility] [place] [address]  [address] [place], [place], [address] Phone: [phone]   Fax:  [phone]  Name: Antonio Ellison MRN: [id number] Date of Birth: July 20, 1966

## 2016-03-05 ENCOUNTER — Ambulatory Visit: Payer: PRIVATE HEALTH INSURANCE

## 2016-03-05 ENCOUNTER — Ambulatory Visit: Payer: PRIVATE HEALTH INSURANCE | Admitting: Occupational Therapy

## 2016-03-05 ENCOUNTER — Telehealth: Payer: Self-pay | Admitting: Physician Assistant

## 2016-03-05 ENCOUNTER — Encounter: Payer: Self-pay | Admitting: Physician Assistant

## 2016-03-05 DIAGNOSIS — I69351 Hemiplegia and hemiparesis following cerebral infarction affecting right dominant side: Secondary | ICD-10-CM | POA: Diagnosis not present

## 2016-03-05 DIAGNOSIS — M545 Low back pain, unspecified: Secondary | ICD-10-CM

## 2016-03-05 DIAGNOSIS — M6281 Muscle weakness (generalized): Secondary | ICD-10-CM

## 2016-03-05 DIAGNOSIS — R278 Other lack of coordination: Secondary | ICD-10-CM

## 2016-03-05 MED ORDER — ATORVASTATIN CALCIUM 80 MG PO TABS: 80.0000 mg | ORAL_TABLET | Freq: Every day | ORAL | 5 refills | Status: DC

## 2016-03-05 MED ORDER — AMLODIPINE BESYLATE 10 MG PO TABS: 10.0000 mg | ORAL_TABLET | Freq: Every day | ORAL | 5 refills | Status: DC

## 2016-03-05 NOTE — Telephone Encounter (Signed)
Rx request to pharmacy/SLS  

## 2016-03-05 NOTE — Telephone Encounter (Signed)
Pt walked into office requesting refills on Amlodipine and Atorvastatin. He said he needs a med that starts with A that the hospital prescribed when he was in recently for sx of a stroke. Pt states he didn't know exactly what it was for but to just send in both meds. He stated he is on so many now and doesn't know what anything is for.  Pharmacy: [id number] - [place], [place] - [address] AT [facility] [address] Contact on Home # [phone]

## 2016-03-05 NOTE — Therapy (Signed)
[facility] [address]  [address] [place], [place], [address] Phone: [phone]   Fax:  [phone]  Physical Therapy Treatment  Patient Details  Name: Antonio Ellison MRN: [id number] Date of Birth: 11-16-1965 Referring Provider: Esperanza Richters, PA-C  Encounter Date: 03/05/2016      PT End of Session - 03/05/16 1120    Visit Number 4   Number of Visits 16   Date for PT Re-Evaluation 04/19/16   PT Start Time 1106   PT Stop Time 1150   PT Time Calculation (min) 44 min   Activity Tolerance Patient tolerated treatment well;Patient limited by lethargy   Behavior During Therapy [facility] for tasks assessed/performed      Past Medical History:  Diagnosis Date  . Asthma   . History of chicken pox    65 Mths old  . Hyperlipidemia   . Hypertension   . Low testosterone   . Stroke [facility]) 2012    Past Surgical History:  Procedure Laterality Date  . ACHILLES TENDON REPAIR     Left Leg    There were no vitals filed for this visit.      Subjective Assessment - 03/05/16 1115    Subjective Pt. reports he wasn't very active over the weekend however was able to perform the HEP each day.  Pt. is pain free initially today.   Currently in Pain? No/denies   Multiple Pain Sites No      Today's treatment:  Therex: Recumbent bike: level 4, 5 min    HEP Review: Hooklying bridge x 15 reps Hooklying bridge with adduction ball squeeze x 15 reps  Hooklying LE march with blue TB around knees x 10 reps each  Hooklying alternating hip abd/ER with blue TB x 10 reps  B sidelying clam shell with blue TB x 10 reps  Gait training: Pt. Able to ambulate around gym track x 1 lap with RW   Neuro re-ed:  Sit<> standing with blue foam airex pad under L foot and alternating L UE reach to cone 2 x 10 reps; to promote L LE/UE weight bearing / facilitation; Standing R LE step onto blue airex pad with R UE reach with placing cone on alternating  foam bolsters x 10 reps each way        PT Short Term Goals - 03/05/16 1216      PT SHORT TERM GOAL #1   Title Independent with initial LE strengthening HEP by 03/23/16   Status Achieved     PT SHORT TERM GOAL #2   Title R LE strength grossly 3+/5 to 4-/5 for improved balance and gait stability by 03/23/16   Status On-going     PT SHORT TERM GOAL #3   Title Pt will safely ambulate on level surfaces with SPC demonstrating adequate R foot clearance to reduce risk for falls by 03/23/16   Status On-going     PT SHORT TERM GOAL #4   Title Pt will demonstrate reduced risk for falls as evidenced by Sharlene Motts Balance Scale score >/= 45/56 by 03/23/16   Status On-going           PT Long Term Goals - 02/28/16 1155      PT LONG TERM GOAL #1   Title Independent with advanced HEP as indicated by 04/19/16   Status On-going     PT LONG TERM GOAL #2   Title R LE strength >/= 4/5 for improved balance and gait stability by  04/19/16   Status On-going     PT LONG TERM GOAL #3   Title Pt will safely ambulate on level surfaces without AD demonstrating normal gait pattern to reduce risk for falls by 04/19/16   Status On-going     PT LONG TERM GOAL #4   Title Pt will demonstrate low risk for falls as evidenced by Sharlene Motts Balance Scale score >/= 52/56 by 04/19/16   Status On-going               Plan - 03/05/16 1116    Clinical Impression Statement Pt. reports he wasn't very active over the weekend however was able to perform the HEP each day.  Pt. is pain free initially today.  Pt. tolerated all supine and standing hip/knee strengthening activity well today with focus on weight shifting over R LE and UE reaching activities.  Pt. with brief episode of dizziness while standing today which quickly resolved upon sitting; pt. BP WFL throughout.     PT Treatment/Interventions Patient/family education;Neuromuscular re-education;Balance training;Therapeutic exercise;Therapeutic activities;Functional mobility  training;Gait training;Stair training;Manual techniques;Taping;Electrical Stimulation;Moist Heat;ADLs/Self Care Home Management   PT Next Visit Plan R LE strengthing progressing to standing and CKC exercises as tolerated; Standing balance/coordination activities; Gait training working toward weaning AD      Patient will benefit from skilled therapeutic intervention in order to improve the following deficits and impairments:  Decreased strength, Decreased coordination, Decreased balance, Impaired tone, Difficulty walking, Abnormal gait, Pain  Visit Diagnosis: Hemiplegia and hemiparesis following cerebral infarction affecting right dominant side (HCC)  Other lack of coordination  Muscle weakness (generalized)  Right-sided low back pain without sciatica     Problem List Patient Active Problem List   Diagnosis Date Noted  . Visit for preventive health examination 01/20/2016  . Daytime somnolence 01/20/2016  . Prostate cancer screening 01/20/2016  . Depression 04/04/2015  . Essential hypertension, benign 04/04/2015  . Chronic fatigue 04/04/2015  . History of stroke 04/04/2015  . Low serum vitamin D 04/04/2015  . Low testosterone 04/04/2015    Kermit Balo, PTA 03/05/2016, 12:28 PM  [facility] [address]  [address] [place], [place], [address] Phone: [phone]   Fax:  [phone]  Name: Milferd Ansell MRN: [id number] Date of Birth: Jan 07, 1966

## 2016-03-05 NOTE — Therapy (Signed)
[facility] [place] [address]  [place], [place], [address] Phone: [phone]   Fax:  [phone]  Occupational Therapy Treatment  Patient Details  Name: Antonio Ellison MRN: [id number] Date of Birth: 23-Jan-1966 Referring Provider: Mackie Pai PA-C  Encounter Date: 03/05/2016      OT End of Session - 03/05/16 1338    Visit Number 4   Number of Visits 17   Date for OT Re-Evaluation 04/16/16   Authorization Type [employer]   Authorization Time Period 60 visit limit (PT referral requested) - WEEK 2/8   Authorization - Visit Number 4   Authorization - Number of Visits 30   OT Start Time 0240   OT Stop Time 1230   OT Time Calculation (min) 45 min   Activity Tolerance Patient tolerated treatment well      Past Medical History:  Diagnosis Date  . Asthma   . History of chicken pox    66 Mths old  . Hyperlipidemia   . Hypertension   . Low testosterone   . Stroke [facility]) 2012    Past Surgical History:  Procedure Laterality Date  . ACHILLES TENDON REPAIR     Left Leg    There were no vitals filed for this visit.      Subjective Assessment - 03/05/16 1155    Subjective  My hand tingles, but no pain   Pertinent History h/o CVA   Patient Stated Goals To get my arms working like they used to   Currently in Pain? No/denies                      OT Treatments/Exercises (OP) - 03/05/16 0001      Neurological Re-education Exercises   Other Exercises 1 Reviewed cane ex's seated and supine. Followed by BUE high level sh. flexion with ball for control of RUE - Pt able to perform with only min v.c's but did not need tactile cues. Pt instructed he can now do this at home.    Weight Bearing Position Quadraped   Other Weight-Bearing Exercises 1 A-P wt shifts in quadraped, followed by disengaging LUE to increase weight over RUE (Modified activity for wrist pain).  Discussed proper shoulder alignment during wt  bearing activities, including getting out of bed, or off floor.    Other Weight-Bearing Exercises 2 Seated: bridging pelvis off mat wt bearing through BUE's in shoulder extension x 5 reps, holding 5 sec.      Functional Reaching Activities   High Level High level reaching RUE to place large pegs in pegboard vertical surface (3 rows without rest) and fatigue but no compensations noted.                   OT Short Term Goals - 03/05/16 1339      OT SHORT TERM GOAL #1   Title Independent with initial HEP (All STG's due 03/17/16)    Time 4   Period Weeks   Status Achieved     OT SHORT TERM GOAL #2   Title Pt to perform bathing with only min assist or less   Baseline max assist   Time 4   Period Weeks   Status On-going     OT SHORT TERM GOAL #3   Title Pt to consistently hook buttons, fastners, cut food, and tie shoes at Mod I level with A/E prn   Time 4   Period Weeks   Status Achieved  OT SHORT TERM GOAL #4   Title Pt to verbalize understanding with pain management strategies for neck and RUE   Time 4   Period Weeks   Status On-going     OT SHORT TERM GOAL #5   Title Pt to improve RUE function as evidenced by performing 25 or greater on Box & Blocks test   Baseline eval = 18 (Lt = 45)    Time 4   Period Weeks   Status On-going           OT Long Term Goals - 02/15/16 1400      OT LONG TERM GOAL #1   Title Independent with updated HEP (All LTG's due 04/16/16)    Time 8   Period Weeks   Status New     OT LONG TERM GOAL #2   Title Improve coordination RUE as evidenced by performing 9 hole peg test in 50 sec. or less   Baseline eval = 86.04 sec (Lt = 25.50 sec)    Time 8   Period Weeks   Status New     OT LONG TERM GOAL #3   Title Improve grip strength Rt hand to 50 lbs or greater to assist with opening jars   Baseline eval = 38 lbs (Lt = 113 lbs)    Time 8   Period Weeks   Status New     OT LONG TERM GOAL #4   Title Pt to retrieve and replace  lightweight object from cabinet RUE requiring 100* shoulder flexion or >   Baseline eval = approx 40* sh. flex   Time 8   Period Weeks   Status New     OT LONG TERM GOAL #5   Title Pt to perform simple meal prep/light cleaning activities Mod I level with DME prn   Time 8   Period Weeks   Status New               Plan - 03/05/16 1340    Clinical Impression Statement Pt met STG's #1 and #3. Pt approximating all other STG's. Pt with improved control and use of RUE with no pain.    Rehab Potential Good   OT Frequency 2x / week   OT Duration 8 weeks   OT Treatment/Interventions Self-care/ADL training;Therapeutic exercise;Functional Mobility Training;Patient/family education;Neuromuscular education;Splinting;Manual Therapy;DME and/or AE instruction;Therapeutic activities;Electrical Stimulation;Parrafin;Cognitive remediation/compensation;Moist Heat;Passive range of motion;Visual/perceptual remediation/compensation   Plan simulate tub transfer, continue functional use and coordination RUE   Consulted and Agree with Plan of Care Patient;Family member/caregiver   Family Member Consulted Mom       Patient will benefit from skilled therapeutic intervention in order to improve the following deficits and impairments:  Decreased coordination, Decreased range of motion, Decreased endurance, Increased edema, Impaired sensation, Decreased knowledge of precautions, Decreased knowledge of use of DME, Impaired UE functional use, Pain, Decreased mobility, Decreased strength, Impaired vision/preception, Decreased cognition  Visit Diagnosis: Hemiplegia and hemiparesis following cerebral infarction affecting right dominant side (HCC)  Other lack of coordination  Muscle weakness (generalized)    Problem List Patient Active Problem List   Diagnosis Date Noted  . Visit for preventive health examination 01/20/2016  . Daytime somnolence 01/20/2016  . Prostate cancer screening 01/20/2016  .  Depression 04/04/2015  . Essential hypertension, benign 04/04/2015  . Chronic fatigue 04/04/2015  . History of stroke 04/04/2015  . Low serum vitamin D 04/04/2015  . Low testosterone 04/04/2015    Carey Bullocks, OTR/L 03/05/2016, 1:42 PM  [facility] [address]  [place], [place], [address] Phone: [phone]   Fax:  [phone]  Name: Antonio Ellison MRN: [id number] Date of Birth: 08-09-1966

## 2016-03-07 ENCOUNTER — Ambulatory Visit: Payer: PRIVATE HEALTH INSURANCE | Admitting: Physical Therapy

## 2016-03-07 ENCOUNTER — Ambulatory Visit: Payer: PRIVATE HEALTH INSURANCE | Admitting: Occupational Therapy

## 2016-03-07 DIAGNOSIS — R278 Other lack of coordination: Secondary | ICD-10-CM

## 2016-03-07 DIAGNOSIS — M545 Low back pain, unspecified: Secondary | ICD-10-CM

## 2016-03-07 DIAGNOSIS — I69351 Hemiplegia and hemiparesis following cerebral infarction affecting right dominant side: Secondary | ICD-10-CM | POA: Diagnosis not present

## 2016-03-07 DIAGNOSIS — M6281 Muscle weakness (generalized): Secondary | ICD-10-CM

## 2016-03-07 NOTE — Therapy (Signed)
[facility] [address]  [address] [place], [place], [address] Phone: [phone]   Fax:  [phone]  Physical Therapy Treatment  Patient Details  Name: Antonio Ellison MRN: [id number] Date of Birth: 07-Jun-1966 Referring Provider: Esperanza Richters, PA-C  Encounter Date: 03/07/2016      PT End of Session - 03/07/16 1108    Visit Number 5   Number of Visits 16   Date for PT Re-Evaluation 04/19/16   PT Start Time 1108  Pt arrived late   PT Stop Time 1146   PT Time Calculation (min) 38 min   Activity Tolerance Patient tolerated treatment well;Patient limited by lethargy   Behavior During Therapy [facility] for tasks assessed/performed      Past Medical History:  Diagnosis Date  . Asthma   . History of chicken pox    35 Mths old  . Hyperlipidemia   . Hypertension   . Low testosterone   . Stroke [facility]) 2012    Past Surgical History:  Procedure Laterality Date  . ACHILLES TENDON REPAIR     Left Leg    There were no vitals filed for this visit.      Subjective Assessment - 03/07/16 1112    Subjective Pt no further back pain since performing exercises as prescribed by PT.   Currently in Pain? No/denies          Today's Treatment  TherEx Recumbent bike -  level 5 x 5 min   Standing at treadmill rail:    Squat 10x3"      Alternating Hip ABD with yellow TB 10x3"    Alternating Hip Extension with yellow TB 10x3"    R High knee Marching with yellow TB x10    B Side stepping with yellow TB 5x8 ft R LAQ with yellow TB 10x3" Sit <> stand w/o UE assist x10  Gait 200 ft with SPC with CGA - cues for step through pattern with appropriate sequencing of SPC and even step length (tendency for excessively lg step on R with smaller step on L)          PT Short Term Goals - 03/05/16 1216      PT SHORT TERM GOAL #1   Title Independent with initial LE strengthening HEP by 03/23/16   Status Achieved     PT SHORT TERM  GOAL #2   Title R LE strength grossly 3+/5 to 4-/5 for improved balance and gait stability by 03/23/16   Status On-going     PT SHORT TERM GOAL #3   Title Pt will safely ambulate on level surfaces with SPC demonstrating adequate R foot clearance to reduce risk for falls by 03/23/16   Status On-going     PT SHORT TERM GOAL #4   Title Pt will demonstrate reduced risk for falls as evidenced by Sharlene Motts Balance Scale score >/= 45/56 by 03/23/16   Status On-going           PT Long Term Goals - 02/28/16 1155      PT LONG TERM GOAL #1   Title Independent with advanced HEP as indicated by 04/19/16   Status On-going     PT LONG TERM GOAL #2   Title R LE strength >/= 4/5 for improved balance and gait stability by 04/19/16   Status On-going     PT LONG TERM GOAL #3   Title Pt will safely ambulate on level surfaces without AD demonstrating normal gait pattern  to reduce risk for falls by 04/19/16   Status On-going     PT LONG TERM GOAL #4   Title Pt will demonstrate low risk for falls as evidenced by Sharlene Motts Balance Scale score >/= 52/56 by 04/19/16   Status On-going               Plan - 03/07/16 1146    Clinical Impression Statement Pt denies any issues with HEP but when squat reviewed, pt acknowledged he had been doing this incorrectly at home. Treatment focus today was on progression of standing strengthening activities with pt reporting good workout with mild fatigue but no pain. Introduced Investment banker, operational with SPC with pt gradually becoming more consistent with correct step pattern, but will require further training before safe to transition to [facility] on his own. Pt also noting continued preference for RW at present due to less fatigued when walking with RW.   PT Treatment/Interventions Patient/family education;Neuromuscular re-education;Balance training;Therapeutic exercise;Therapeutic activities;Functional mobility training;Gait training;Stair training;Manual techniques;Taping;Electrical  Stimulation;Moist Heat;ADLs/Self Care Home Management   PT Next Visit Plan R LE strengthing progressing to standing and CKC exercises as tolerated; Standing balance/coordination activities; Gait training working toward weaning AD      Patient will benefit from skilled therapeutic intervention in order to improve the following deficits and impairments:  Decreased strength, Decreased coordination, Decreased balance, Impaired tone, Difficulty walking, Abnormal gait, Pain  Visit Diagnosis: Hemiplegia and hemiparesis following cerebral infarction affecting right dominant side (HCC)  Other lack of coordination  Muscle weakness (generalized)  Right-sided low back pain without sciatica     Problem List Patient Active Problem List   Diagnosis Date Noted  . Visit for preventive health examination 01/20/2016  . Daytime somnolence 01/20/2016  . Prostate cancer screening 01/20/2016  . Depression 04/04/2015  . Essential hypertension, benign 04/04/2015  . Chronic fatigue 04/04/2015  . History of stroke 04/04/2015  . Low serum vitamin D 04/04/2015  . Low testosterone 04/04/2015    Marry Guan, PT, MPT 03/07/2016, 12:49 PM  [facility] [address]  [address] [place], [place], [address] Phone: [phone]   Fax:  [phone]  Name: Antonio Ellison MRN: [id number] Date of Birth: October 14, 1965

## 2016-03-07 NOTE — Therapy (Signed)
[facility] [address]  [place], [place], [address] Phone: [phone]   Fax:  [phone]  Occupational Therapy Treatment  Patient Details  Name: Antonio Ellison MRN: [id number] Date of Birth: 15-May-1966 Referring Provider: Mackie Pai PA-C  Encounter Date: 03/07/2016      OT End of Session - 03/07/16 1358    Visit Number 5   Number of Visits 17   Date for OT Re-Evaluation 04/16/16   Authorization Type [employer]   Authorization Time Period 60 visit limit (PT referral requested) - WEEK 2/8   Authorization - Visit Number 5   Authorization - Number of Visits 30   OT Start Time 1145   OT Stop Time 1230   OT Time Calculation (min) 45 min   Activity Tolerance Patient tolerated treatment well      Past Medical History:  Diagnosis Date  . Asthma   . History of chicken pox    75 Mths old  . Hyperlipidemia   . Hypertension   . Low testosterone   . Stroke [facility]) 2012    Past Surgical History:  Procedure Laterality Date  . ACHILLES TENDON REPAIR     Left Leg    There were no vitals filed for this visit.      Subjective Assessment - 03/07/16 1227    Subjective  I haven't had pain   Patient is accompained by: Family member  mom   Pertinent History h/o CVA   Patient Stated Goals To get my arms working like they used to   Currently in Pain? No/denies                      OT Treatments/Exercises (OP) - 03/07/16 0001      ADLs   Bathing Simulated tub transfers with tub transfer bench way and with shower chair way. Discussed safest way to perform with tub transfer bench. However, stressed if pt continues to do the way he is performing with shower chair, grab bar would need to be installed for fall prevention. Also showed pt/mother how to set up bench and shower liner properly. Issued handout and told how to obtain tub bench.      Fine Motor Coordination   Fine Motor Coordination Small Pegboard    Small Pegboard Pt placing small pegs in pegboard with Rt hand with min to mod difficulty and copying complex peg design with 100% accuracy                OT Education - 03/07/16 1357    Education provided Yes   Education Details Recommended DME for safety with tub transfers   Person(s) Educated Patient;Parent(s)   Methods Explanation;Demonstration;Handout   Comprehension Verbalized understanding;Returned demonstration          OT Short Term Goals - 03/07/16 1359      OT SHORT TERM GOAL #1   Title Independent with initial HEP (All STG's due 03/17/16)    Time 4   Period Weeks   Status Achieved     OT SHORT TERM GOAL #2   Title Pt to perform bathing with only min assist or less   Baseline max assist   Time 4   Period Weeks   Status Achieved     OT SHORT TERM GOAL #3   Title Pt to consistently hook buttons, fastners, cut food, and tie shoes at Mod I level with A/E prn   Time 4   Period  Weeks   Status Achieved  except cutting food     OT SHORT TERM GOAL #4   Title Pt to verbalize understanding with pain management strategies for neck and RUE   Time 4   Period Weeks   Status Deferred  no longer needed - pt has had no pain      OT SHORT TERM GOAL #5   Title Pt to improve RUE function as evidenced by performing 25 or greater on Box & Blocks test   Baseline eval = 18 (Lt = 45)    Time 4   Period Weeks   Status On-going           OT Long Term Goals - 02/15/16 1400      OT LONG TERM GOAL #1   Title Independent with updated HEP (All LTG's due 04/16/16)    Time 8   Period Weeks   Status New     OT LONG TERM GOAL #2   Title Improve coordination RUE as evidenced by performing 9 hole peg test in 50 sec. or less   Baseline eval = 86.04 sec (Lt = 25.50 sec)    Time 8   Period Weeks   Status New     OT LONG TERM GOAL #3   Title Improve grip strength Rt hand to 50 lbs or greater to assist with opening jars   Baseline eval = 38 lbs (Lt = 113 lbs)    Time 8    Period Weeks   Status New     OT LONG TERM GOAL #4   Title Pt to retrieve and replace lightweight object from cabinet RUE requiring 100* shoulder flexion or >   Baseline eval = approx 40* sh. flex   Time 8   Period Weeks   Status New     OT LONG TERM GOAL #5   Title Pt to perform simple meal prep/light cleaning activities Mod I level with DME prn   Time 8   Period Weeks   Status New               Plan - 03/07/16 1359    Clinical Impression Statement Pt met STG #2. Deferred STG #4 - no longer needed. Pt with decreased balance and recommended DME for safety with tub transfers   Rehab Potential Good   OT Frequency 2x / week   OT Duration 8 weeks   OT Treatment/Interventions Self-care/ADL training;Therapeutic exercise;Functional Mobility Training;Patient/family education;Neuromuscular education;Splinting;Manual Therapy;DME and/or AE instruction;Therapeutic activities;Electrical Stimulation;Parrafin;Cognitive remediation/compensation;Moist Heat;Passive range of motion;Visual/perceptual remediation/compensation   Plan continue NMR and coordination RUE, Assess STG #5   Consulted and Agree with Plan of Care Patient;Family member/caregiver   Family Member Consulted Mom       Patient will benefit from skilled therapeutic intervention in order to improve the following deficits and impairments:  Decreased coordination, Decreased range of motion, Decreased endurance, Increased edema, Impaired sensation, Decreased knowledge of precautions, Decreased knowledge of use of DME, Impaired UE functional use, Pain, Decreased mobility, Decreased strength, Impaired vision/preception, Decreased cognition  Visit Diagnosis: Hemiplegia and hemiparesis following cerebral infarction affecting right dominant side ([place])  Other lack of coordination    Problem List Patient Active Problem List   Diagnosis Date Noted  . Visit for preventive health examination 01/20/2016  . Daytime somnolence  01/20/2016  . Prostate cancer screening 01/20/2016  . Depression 04/04/2015  . Essential hypertension, benign 04/04/2015  . Chronic fatigue 04/04/2015  . History of stroke 04/04/2015  . Low  serum vitamin D 04/04/2015  . Low testosterone 04/04/2015    Carey Bullocks, OTR/L 03/07/2016, 2:02 PM  [facility] [address]  [place], [place], [address] Phone: [phone]   Fax:  [phone]  Name: Antonio Ellison MRN: [id number] Date of Birth: [birth year]

## 2016-03-12 ENCOUNTER — Ambulatory Visit: Payer: PRIVATE HEALTH INSURANCE | Admitting: Occupational Therapy

## 2016-03-12 ENCOUNTER — Ambulatory Visit: Payer: PRIVATE HEALTH INSURANCE

## 2016-03-12 DIAGNOSIS — R278 Other lack of coordination: Secondary | ICD-10-CM

## 2016-03-12 DIAGNOSIS — I69351 Hemiplegia and hemiparesis following cerebral infarction affecting right dominant side: Secondary | ICD-10-CM | POA: Diagnosis not present

## 2016-03-12 DIAGNOSIS — M79601 Pain in right arm: Secondary | ICD-10-CM

## 2016-03-12 DIAGNOSIS — M6281 Muscle weakness (generalized): Secondary | ICD-10-CM

## 2016-03-12 DIAGNOSIS — M545 Low back pain, unspecified: Secondary | ICD-10-CM

## 2016-03-12 NOTE — Therapy (Signed)
[facility] [address]  [place], [place], [address] Phone: [phone]   Fax:  [phone]  Occupational Therapy Treatment  Patient Details  Name: Antonio Ellison MRN: [id number] Date of Birth: 15-Apr-1966 Referring Provider: Mackie Pai PA-C  Encounter Date: 03/12/2016      OT End of Session - 03/12/16 1347    Visit Number 6   Number of Visits 17   Date for OT Re-Evaluation 04/16/16   Authorization Type [employer]   Authorization Time Period 60 visit limit (PT referral requested) - WEEK 3/8   Authorization - Visit Number 6   Authorization - Number of Visits 30   OT Start Time 6015   OT Stop Time 1230   OT Time Calculation (min) 45 min   Activity Tolerance Patient tolerated treatment well      Past Medical History:  Diagnosis Date  . Asthma   . History of chicken pox    17 Mths old  . Hyperlipidemia   . Hypertension   . Low testosterone   . Stroke [facility]) 2012    Past Surgical History:  Procedure Laterality Date  . ACHILLES TENDON REPAIR     Left Leg    There were no vitals filed for this visit.      Subjective Assessment - 03/12/16 1150    Subjective  My Rt hand still feels like it's asleep   Pertinent History h/o CVA   Patient Stated Goals To get my arms working like they used to   Currently in Pain? No/denies            [facility] OT Assessment - 03/12/16 0001      Coordination   Box and Blocks Rt = 36     Hand Function   Right Hand Grip (lbs) 68 lbs                  OT Treatments/Exercises (OP) - 03/12/16 0001      ADLs   ADL Comments Assessed STG #5 and LTG #3 and discussed improvements with pt and mother     Hand Exercises   Other Hand Exercises Gripper set at 35 lbs. resistance to pick up blocks with Rt hand for sustained grip strength, control and coordination. Pt with max difficulty and drops during task     Fine Motor Coordination   Fine Motor Coordination  Manipulation of small objects   Manipulation of small objects Translating stress balls in Rt hand with max difficulty. Manipulating block, then dii in fingertips (rotating on different sides in correct # order) - pt able to do with block with only min difficulty, using dii with mod difficulty and 1 drop.    Other Fine Motor Exercises Pickcing up items with tweezers Rt hand for control and coordination.   Other Fine Motor Exercises Practiced pre-writing ex's for coordination and control: tracing over shapes and letters with highlighter. Pt with increased difficulty tracing over circles     Functional Reaching Activities   Mid Level Mid to high level reaching to place Connect 4 pcs into slots requiring approx. 100* of shoulder flexion RUE for ROM, strength/endurance, and coordination                  OT Short Term Goals - 03/12/16 1347      OT SHORT TERM GOAL #1   Title Independent with initial HEP (All STG's due 03/17/16)    Time 4   Period Weeks  Status Achieved     OT SHORT TERM GOAL #2   Title Pt to perform bathing with only min assist or less   Baseline max assist   Time 4   Period Weeks   Status Achieved     OT SHORT TERM GOAL #3   Title Pt to consistently hook buttons, fastners, cut food, and tie shoes at Mod I level with A/E prn   Time 4   Period Weeks   Status Achieved  except cutting food     OT SHORT TERM GOAL #4   Title Pt to verbalize understanding with pain management strategies for neck and RUE   Time 4   Period Weeks   Status Deferred  no longer needed - pt has had no pain      OT SHORT TERM GOAL #5   Title Pt to improve RUE function as evidenced by performing 25 or greater on Box & Blocks test   Baseline eval = 18 (Lt = 45)    Time 4   Period Weeks   Status Achieved  03/12/16: 36 BLOCKS           OT Long Term Goals - 03/12/16 1348      OT LONG TERM GOAL #1   Title Independent with updated HEP (All LTG's due 04/16/16)    Time 8   Period  Weeks   Status New     OT LONG TERM GOAL #2   Title Improve coordination RUE as evidenced by performing 9 hole peg test in 50 sec. or less   Baseline eval = 86.04 sec (Lt = 25.50 sec)    Time 8   Period Weeks   Status New     OT LONG TERM GOAL #3   Title Improve grip strength Rt hand to 50 lbs or greater to assist with opening jars   Baseline eval = 38 lbs (Lt = 113 lbs)    Time 8   Period Weeks   Status Achieved  03/12/16: 68 LBS     OT LONG TERM GOAL #4   Title Pt to retrieve and replace lightweight object from cabinet RUE requiring 100* shoulder flexion or >   Baseline eval = approx 40* sh. flex   Time 8   Period Weeks   Status New     OT LONG TERM GOAL #5   Title Pt to perform simple meal prep/light cleaning activities Mod I level with DME prn   Time 8   Period Weeks   Status New               Plan - 03/12/16 1348    Clinical Impression Statement Pt met STG #5 and LTG #3. Pt improving with RUE coordination, strength and functional use.    Rehab Potential Good   OT Frequency 2x / week   OT Duration 8 weeks   OT Treatment/Interventions Self-care/ADL training;Therapeutic exercise;Functional Mobility Training;Patient/family education;Neuromuscular education;Splinting;Manual Therapy;DME and/or AE instruction;Therapeutic activities;Electrical Stimulation;Parrafin;Cognitive remediation/compensation;Moist Heat;Passive range of motion;Visual/perceptual remediation/compensation   Plan high level NMR RUE, writing on vertical surface   Consulted and Agree with Plan of Care Patient;Family member/caregiver   Family Member Consulted Mom       Patient will benefit from skilled therapeutic intervention in order to improve the following deficits and impairments:  Decreased coordination, Decreased range of motion, Decreased endurance, Increased edema, Impaired sensation, Decreased knowledge of precautions, Decreased knowledge of use of DME, Impaired UE functional use, Pain,  Decreased mobility, Decreased strength,  Impaired vision/preception, Decreased cognition  Visit Diagnosis: Other lack of coordination  Hemiplegia and hemiparesis following cerebral infarction affecting right dominant side (H[facility])  Muscle weakness (generalized)    Problem List Patient Active Problem List   Diagnosis Date Noted  . Visit for preventive health examination 01/20/2016  . Daytime somnolence 01/20/2016  . Prostate cancer screening 01/20/2016  . Depression 04/04/2015  . Essential hypertension, benign 04/04/2015  . Chronic fatigue 04/04/2015  . History of stroke 04/04/2015  . Low serum vitamin D 04/04/2015  . Low testosterone 04/04/2015    Carey Bullocks, OTR/L 03/12/2016, 1:52 PM  [facility] [address]  [place], [place], [address] Phone: [phone]   Fax:  [phone]  Name: Demarious Kapur MRN: [id number] Date of Birth: 01-Jun-1966

## 2016-03-12 NOTE — Therapy (Signed)
[facility] [address]  [address] [place], [place], [address] Phone: [phone]   Fax:  [phone]  Physical Therapy Treatment  Patient Details  Name: Antonio Ellison MRN: [id number] Date of Birth: Mar 28, 1966 Referring Provider: Esperanza Richters, PA-C  Encounter Date: 03/12/2016      PT End of Session - 03/12/16 1112    Visit Number 6   Number of Visits 16   Date for PT Re-Evaluation 04/19/16   PT Start Time 1108   PT Stop Time 1150   PT Time Calculation (min) 42 min   Activity Tolerance Patient tolerated treatment well;Patient limited by lethargy   Behavior During Therapy [facility] for tasks assessed/performed      Past Medical History:  Diagnosis Date  . Asthma   . History of chicken pox    33 Mths old  . Hyperlipidemia   . Hypertension   . Low testosterone   . Stroke [facility]) 2012    Past Surgical History:  Procedure Laterality Date  . ACHILLES TENDON REPAIR     Left Leg    There were no vitals filed for this visit.      Subjective Assessment - 03/12/16 1111    Subjective Pt. reports he is pain free today however still with R hand numbness/tingling initially.     Patient Stated Goals "walk w/o a walker"   Currently in Pain? No/denies   Pain Score 0-No pain   Multiple Pain Sites No       Today's treatment:  Therex: Nustep: 6 min, level 7  Neuro Re-ed: Sit<>Stand with blue oval under L LE (to encourage R wt. Shift 2 x 10 reps  Alternating step onto blue airex pad with cross-over reach to orange cone on white foam bolster x 10 reps each Alternating step onto BOSU ball (up) with cross-over reach to orange cone on white foam bolster 2 x 10 reps each; 2nd set with progressive distance  Therex: Hooklying bridge x 15 reps Hooklying bridge with alternating hip abd/ER with black looped TB x 10 reps  L sidelying R clam shell with black TB x 7 reps  Hooklying alternating LE march with black TB x 10 reps each  side          PT Short Term Goals - 03/05/16 1216      PT SHORT TERM GOAL #1   Title Independent with initial LE strengthening HEP by 03/23/16   Status Achieved     PT SHORT TERM GOAL #2   Title R LE strength grossly 3+/5 to 4-/5 for improved balance and gait stability by 03/23/16   Status On-going     PT SHORT TERM GOAL #3   Title Pt will safely ambulate on level surfaces with SPC demonstrating adequate R foot clearance to reduce risk for falls by 03/23/16   Status On-going     PT SHORT TERM GOAL #4   Title Pt will demonstrate reduced risk for falls as evidenced by Sharlene Motts Balance Scale score >/= 45/56 by 03/23/16   Status On-going           PT Long Term Goals - 02/28/16 1155      PT LONG TERM GOAL #1   Title Independent with advanced HEP as indicated by 04/19/16   Status On-going     PT LONG TERM GOAL #2   Title R LE strength >/= 4/5 for improved balance and gait stability by 04/19/16   Status On-going  PT LONG TERM GOAL #3   Title Pt will safely ambulate on level surfaces without AD demonstrating normal gait pattern to reduce risk for falls by 04/19/16   Status On-going     PT LONG TERM GOAL #4   Title Pt will demonstrate low risk for falls as evidenced by Sharlene Motts Balance Scale score >/= 52/56 by 04/19/16   Status On-going               Plan - 03/12/16 1113    Clinical Impression Statement Pt. reports he is pain free initially today however still with R hand numbness/tingling. Pt. tolerated all stepping/reaching activities well today however continues to require close CGA from therapist with all standing balance activity.  All standing activity encouraging R wt. bearing and wt. shifting behavior to increase return.  Pt. to see PT on 8/2/.     PT Treatment/Interventions Patient/family education;Neuromuscular re-education;Balance training;Therapeutic exercise;Therapeutic activities;Functional mobility training;Gait training;Stair training;Manual  techniques;Taping;Electrical Stimulation;Moist Heat;ADLs/Self Care Home Management   PT Next Visit Plan R LE strengthing progressing to standing and CKC exercises as tolerated; Standing balance/coordination activities; Gait training working toward weaning AD      Patient will benefit from skilled therapeutic intervention in order to improve the following deficits and impairments:  Decreased strength, Decreased coordination, Decreased balance, Impaired tone, Difficulty walking, Abnormal gait, Pain  Visit Diagnosis: Hemiplegia and hemiparesis following cerebral infarction affecting right dominant side ([facility])  Other lack of coordination  Muscle weakness (generalized)  Right-sided low back pain without sciatica  Pain In Right Arm     Problem List Patient Active Problem List   Diagnosis Date Noted  . Visit for preventive health examination 01/20/2016  . Daytime somnolence 01/20/2016  . Prostate cancer screening 01/20/2016  . Depression 04/04/2015  . Essential hypertension, benign 04/04/2015  . Chronic fatigue 04/04/2015  . History of stroke 04/04/2015  . Low serum vitamin D 04/04/2015  . Low testosterone 04/04/2015    Kermit Balo, PTA 03/12/2016, 6:55 PM  [facility] [address]  [address] [place], [place], [address] Phone: [phone]   Fax:  [phone]  Name: Anden Bartolo MRN: [id number] Date of Birth: 07-15-1966

## 2016-03-14 ENCOUNTER — Ambulatory Visit: Payer: PRIVATE HEALTH INSURANCE | Attending: Medical | Admitting: Occupational Therapy

## 2016-03-14 ENCOUNTER — Ambulatory Visit: Payer: PRIVATE HEALTH INSURANCE

## 2016-03-14 DIAGNOSIS — I69351 Hemiplegia and hemiparesis following cerebral infarction affecting right dominant side: Secondary | ICD-10-CM | POA: Diagnosis not present

## 2016-03-14 DIAGNOSIS — M545 Low back pain, unspecified: Secondary | ICD-10-CM

## 2016-03-14 DIAGNOSIS — R278 Other lack of coordination: Secondary | ICD-10-CM | POA: Diagnosis present

## 2016-03-14 DIAGNOSIS — M6281 Muscle weakness (generalized): Secondary | ICD-10-CM | POA: Diagnosis present

## 2016-03-14 NOTE — Therapy (Signed)
[facility] [address]  [address] [place], [place], [address] Phone: [phone]   Fax:  [phone]  Physical Therapy Treatment  Patient Details  Name: Antonio Ellison MRN: [id number] Date of Birth: 1966-04-28 Referring Provider: Esperanza Richters, PA-C  Encounter Date: 03/14/2016      PT End of Session - 03/14/16 1126    Visit Number 7   Number of Visits 16   Date for PT Re-Evaluation 04/19/16   PT Start Time 1111  pt. arrive late to PT today.     PT Stop Time 1153   PT Time Calculation (min) 42 min   Activity Tolerance Patient tolerated treatment well;Patient limited by lethargy   Behavior During Therapy [facility] for tasks assessed/performed      Past Medical History:  Diagnosis Date  . Asthma   . History of chicken pox    55 Mths old  . Hyperlipidemia   . Hypertension   . Low testosterone   . Stroke [facility]) 2012    Past Surgical History:  Procedure Laterality Date  . ACHILLES TENDON REPAIR     Left Leg    There were no vitals filed for this visit.      Subjective Assessment - 03/14/16 1117    Subjective Pt. reports he is pain free today and was seen initially wearing a jacket, stating, I want to sweat and lose this weight".    Patient Stated Goals "walk w/o a walker"   Currently in Pain? No/denies   Pain Score 0-No pain   Multiple Pain Sites No      Today's treatment:  Therex: Nustep: 6 min, level 7  Neuro Re-ed: Sit<>Stand with blue oval under L LE (to encourage R wt. Shift 3 x 10 reps  Alternating step onto blue airex pad with cross-over reach to red cone on white foam bolster x 10 reps each Alternating step onto BOSU ball (up) with cross-over reach to orange cone on white foam bolster 2 x 10 reps each; 2nd set with progressive distance  Therex: Hooklying bridge x 15 reps Hooklying bridge with R hip abd/ER with black looped TB x 10 reps  Hooklying alternating LE march with black TB x 10 reps each  side        PT Short Term Goals - 03/05/16 1216      PT SHORT TERM GOAL #1   Title Independent with initial LE strengthening HEP by 03/23/16   Status Achieved     PT SHORT TERM GOAL #2   Title R LE strength grossly 3+/5 to 4-/5 for improved balance and gait stability by 03/23/16   Status On-going     PT SHORT TERM GOAL #3   Title Pt will safely ambulate on level surfaces with SPC demonstrating adequate R foot clearance to reduce risk for falls by 03/23/16   Status On-going     PT SHORT TERM GOAL #4   Title Pt will demonstrate reduced risk for falls as evidenced by Sharlene Motts Balance Scale score >/= 45/56 by 03/23/16   Status On-going           PT Long Term Goals - 02/28/16 1155      PT LONG TERM GOAL #1   Title Independent with advanced HEP as indicated by 04/19/16   Status On-going     PT LONG TERM GOAL #2   Title R LE strength >/= 4/5 for improved balance and gait stability by 04/19/16   Status On-going  PT LONG TERM GOAL #3   Title Pt will safely ambulate on level surfaces without AD demonstrating normal gait pattern to reduce risk for falls by 04/19/16   Status On-going     PT LONG TERM GOAL #4   Title Pt will demonstrate low risk for falls as evidenced by Sharlene Motts Balance Scale score >/= 52/56 by 04/19/16   Status On-going               Plan - 03/14/16 1130    Clinical Impression Statement Pt. reports he is pain free today and was seen initially wearing a jacket, stating, I want to sweat and lose this weight". Pt. tolerated all standing, stepping balance activity today well with continued focus on R wt. shifting and reaching activities.  pt. able to tolerate increase duration and sets of standing actiivty today however did report mild R LE fatigue following this.     PT Treatment/Interventions Patient/family education;Neuromuscular re-education;Balance training;Therapeutic exercise;Therapeutic activities;Functional mobility training;Gait training;Stair training;Manual  techniques;Taping;Electrical Stimulation;Moist Heat;ADLs/Self Care Home Management   PT Next Visit Plan R LE strengthing progressing to standing and CKC exercises as tolerated; Standing balance/coordination activities; Gait training working toward weaning AD      Patient will benefit from skilled therapeutic intervention in order to improve the following deficits and impairments:  Decreased strength, Decreased coordination, Decreased balance, Impaired tone, Difficulty walking, Abnormal gait, Pain  Visit Diagnosis: Hemiplegia and hemiparesis following cerebral infarction affecting right dominant side (HCC)  Other lack of coordination  Muscle weakness (generalized)  Right-sided low back pain without sciatica     Problem List Patient Active Problem List   Diagnosis Date Noted  . Visit for preventive health examination 01/20/2016  . Daytime somnolence 01/20/2016  . Prostate cancer screening 01/20/2016  . Depression 04/04/2015  . Essential hypertension, benign 04/04/2015  . Chronic fatigue 04/04/2015  . History of stroke 04/04/2015  . Low serum vitamin D 04/04/2015  . Low testosterone 04/04/2015    Kermit Balo, PTA 03/14/2016, 12:15 PM  [facility] [address]  [address] [place], [place], [address] Phone: [phone]   Fax:  [phone]  Name: Antonio Ellison MRN: [id number] Date of Birth: 1966-07-14

## 2016-03-14 NOTE — Therapy (Signed)
[facility] [address]  [address] [place], [place], [address] Phone: [phone]   Fax:  [phone]  Occupational Therapy Treatment  Patient Details  Name: Antonio Ellison MRN: [id number] Date of Birth: July 11, 1966 Referring Provider: Esperanza Richters PA-C  Encounter Date: 03/14/2016      OT End of Session - 03/14/16 1306    Visit Number 7   Number of Visits 17   Date for OT Re-Evaluation 04/16/16   Authorization Type [employer]   Authorization Time Period 60 visit limit (PT referral requested) - WEEK 3/8   Authorization - Visit Number 7   Authorization - Number of Visits 30   OT Start Time 1150   OT Stop Time 1235   OT Time Calculation (min) 45 min   Activity Tolerance Patient tolerated treatment well      Past Medical History:  Diagnosis Date  . Asthma   . History of chicken pox    33 Mths old  . Hyperlipidemia   . Hypertension   . Low testosterone   . Stroke [facility]) 2012    Past Surgical History:  Procedure Laterality Date  . ACHILLES TENDON REPAIR     Left Leg    There were no vitals filed for this visit.      Subjective Assessment - 03/14/16 1300    Subjective  You'll wore me out good today   Patient is accompained by: Family member   Pertinent History h/o CVA   Patient Stated Goals To get my arms working like they used to   Currently in Pain? No/denies                      OT Treatments/Exercises (OP) - 03/14/16 0001      Fine Motor Coordination   Other Fine Motor Exercises Reviewed card exercises and coin manipulation ex's with patient - pt return demo with greater improvement than last time     Neurological Re-education Exercises   Other Exercises 1 Bilateral UE high level shoulder flexion seated with ball with cues for elbow ext RUE. Followed by RUE AA/ROM in high level sh. flexion with UE Ranger for control and endurance.    Other Exercises 2 BUE AA/ROM high level sh. flexion with  physioball against wall. Progressed to scapula stabalization with high level sh. flexion and scapula retraction off ball (alternating arms) with fatigue and cues to maintain elbow ext RUE     Functional Reaching Activities   Mid Level Maintaining mid level sh. flexion RUE to trace and write on vertical surface for control, coordination and strength/endurance. Pt tracing over lines, shapes, free writing (print and cursive)                   OT Short Term Goals - 03/12/16 1347      OT SHORT TERM GOAL #1   Title Independent with initial HEP (All STG's due 03/17/16)    Time 4   Period Weeks   Status Achieved     OT SHORT TERM GOAL #2   Title Pt to perform bathing with only min assist or less   Baseline max assist   Time 4   Period Weeks   Status Achieved     OT SHORT TERM GOAL #3   Title Pt to consistently hook buttons, fastners, cut food, and tie shoes at Mod I level with A/E prn   Time 4   Period Weeks   Status Achieved  except cutting food     OT SHORT TERM GOAL #4   Title Pt to verbalize understanding with pain management strategies for neck and RUE   Time 4   Period Weeks   Status Deferred  no longer needed - pt has had no pain      OT SHORT TERM GOAL #5   Title Pt to improve RUE function as evidenced by performing 25 or greater on Box & Blocks test   Baseline eval = 18 (Lt = 45)    Time 4   Period Weeks   Status Achieved  03/12/16: 36 BLOCKS           OT Long Term Goals - 03/12/16 1348      OT LONG TERM GOAL #1   Title Independent with updated HEP (All LTG's due 04/16/16)    Time 8   Period Weeks   Status New     OT LONG TERM GOAL #2   Title Improve coordination RUE as evidenced by performing 9 hole peg test in 50 sec. or less   Baseline eval = 86.04 sec (Lt = 25.50 sec)    Time 8   Period Weeks   Status New     OT LONG TERM GOAL #3   Title Improve grip strength Rt hand to 50 lbs or greater to assist with opening jars   Baseline eval = 38 lbs  (Lt = 113 lbs)    Time 8   Period Weeks   Status Achieved  03/12/16: 68 LBS     OT LONG TERM GOAL #4   Title Pt to retrieve and replace lightweight object from cabinet RUE requiring 100* shoulder flexion or >   Baseline eval = approx 40* sh. flex   Time 8   Period Weeks   Status New     OT LONG TERM GOAL #5   Title Pt to perform simple meal prep/light cleaning activities Mod I level with DME prn   Time 8   Period Weeks   Status New               Plan - 03/14/16 1307    Clinical Impression Statement Pt with increased control during high level activities and improved RUE coordination during activities and writing.    Rehab Potential Good   OT Frequency 2x / week   OT Duration 8 weeks   OT Treatment/Interventions Self-care/ADL training;Therapeutic exercise;Functional Mobility Training;Patient/family education;Neuromuscular education;Splinting;Manual Therapy;DME and/or AE instruction;Therapeutic activities;Electrical Stimulation;Parrafin;Cognitive remediation/compensation;Moist Heat;Passive range of motion;Visual/perceptual remediation/compensation   Plan practice writing, continue FM and gross coordination   Consulted and Agree with Plan of Care Patient;Family member/caregiver   Family Member Consulted Mom       Patient will benefit from skilled therapeutic intervention in order to improve the following deficits and impairments:  Decreased coordination, Decreased range of motion, Decreased endurance, Increased edema, Impaired sensation, Decreased knowledge of precautions, Decreased knowledge of use of DME, Impaired UE functional use, Pain, Decreased mobility, Decreased strength, Impaired vision/preception, Decreased cognition  Visit Diagnosis: Hemiplegia and hemiparesis following cerebral infarction affecting right dominant side (HCC)  Other lack of coordination  Muscle weakness (generalized)    Problem List Patient Active Problem List   Diagnosis Date Noted  . Visit  for preventive health examination 01/20/2016  . Daytime somnolence 01/20/2016  . Prostate cancer screening 01/20/2016  . Depression 04/04/2015  . Essential hypertension, benign 04/04/2015  . Chronic fatigue 04/04/2015  . History of stroke 04/04/2015  . Low serum  vitamin D 04/04/2015  . Low testosterone 04/04/2015    Kelli Churn, OTR/L 03/14/2016, 1:08 PM  [facility] [address]  [address] [place], [place], [address] Phone: [phone]   Fax:  [phone]  Name: Antonio Ellison MRN: [id number] Date of Birth: [birth year]

## 2016-03-19 ENCOUNTER — Ambulatory Visit: Payer: PRIVATE HEALTH INSURANCE | Admitting: Occupational Therapy

## 2016-03-19 ENCOUNTER — Ambulatory Visit: Payer: PRIVATE HEALTH INSURANCE | Admitting: Physical Therapy

## 2016-03-19 DIAGNOSIS — M545 Low back pain, unspecified: Secondary | ICD-10-CM

## 2016-03-19 DIAGNOSIS — I69351 Hemiplegia and hemiparesis following cerebral infarction affecting right dominant side: Secondary | ICD-10-CM

## 2016-03-19 DIAGNOSIS — M6281 Muscle weakness (generalized): Secondary | ICD-10-CM

## 2016-03-19 DIAGNOSIS — R278 Other lack of coordination: Secondary | ICD-10-CM

## 2016-03-19 NOTE — Therapy (Signed)
[facility] [address]  [address] [place], [place], [address] Phone: [phone]   Fax:  [phone]  Physical Therapy Treatment  Patient Details  Name: Antonio Natalric Ellison MRN: [id number] Date of Birth: 03-Feb-1966 Referring Provider: Esperanza RichtersEdward Saguier, PA-C  Encounter Date: 03/19/2016      PT End of Session - 03/19/16 1114    Visit Number 8   Number of Visits 16   Date for PT Re-Evaluation 04/19/16   PT Start Time 1114  Pt arrived late   PT Stop Time 1148   PT Time Calculation (min) 34 min   Activity Tolerance Patient tolerated treatment well   Behavior [facility] for tasks assessed/performed      Past Medical History:  Diagnosis Date  . Asthma   . History of chicken pox    4117 Mths old  . Hyperlipidemia   . Hypertension   . Low testosterone   . Stroke [facility](HCC) 2012    Past Surgical History:  Procedure Laterality Date  . ACHILLES TENDON REPAIR     Left Leg    There were no vitals filed for this visit.      Subjective Assessment - 03/19/16 1114    Subjective Pt noting soreness in buttocks today, but unable to identify trigger.   Patient Stated Goals "walk w/o a walker"   Currently in Pain? Yes   Pain Score 3    Pain Location Buttocks   Pain Orientation Right;Left          Today's Treatment  TherEx Nustep - lvl 7 x 5' Manual B HS, ITB & KTOS stretches x30" B HS stretch with strap x30" B ITB stretch with strap x30" B KTOS pirformis stretch x30" Standing with RW - B 4 way SLR with red TB x10 each          PT Education - 03/19/16 1147    Education provided Yes   Education Details Hip stretching HEP   Person(s) Educated Patient   Methods Explanation;Demonstration;Handout   Comprehension Verbalized understanding;Returned demonstration;Need further instruction          PT Short Term Goals - 03/19/16 1147      PT SHORT TERM GOAL #1   Title Independent with initial LE strengthening HEP  by 03/23/16   Status Achieved     PT SHORT TERM GOAL #2   Title R LE strength grossly 3+/5 to 4-/5 for improved balance and gait stability by 03/23/16   Status On-going     PT SHORT TERM GOAL #3   Title Pt will safely ambulate on level surfaces with SPC demonstrating adequate R foot clearance to reduce risk for falls by 03/23/16   Status On-going     PT SHORT TERM GOAL #4   Title Pt will demonstrate reduced risk for falls as evidenced by Sharlene MottsBerg Balance Scale score >/= 45/56 by 03/23/16   Status On-going           PT Long Term Goals - 02/28/16 1155      PT LONG TERM GOAL #1   Title Independent with advanced HEP as indicated by 04/19/16   Status On-going     PT LONG TERM GOAL #2   Title R LE strength >/= 4/5 for improved balance and gait stability by 04/19/16   Status On-going     PT LONG TERM GOAL #3   Title Pt will safely ambulate on level surfaces without AD demonstrating normal gait pattern to reduce risk  for falls by 04/19/16   Status On-going     PT LONG TERM GOAL #4   Title Pt will demonstrate low risk for falls as evidenced by Sharlene Motts Balance Scale score >/= 52/56 by 04/19/16   Status On-going               Plan - 03/19/16 1148    Clinical Impression Statement Pt reporting increased soreness in both buttocks today, most notable per pt when sitting or bringing leg across body while rolling to get OOB. Tightness noted in B HS, ITB & piriformis therefore manual stretching completed along with training in home stretches with HEP handout provided. Limited time remaining in visit due to pt late arrival but continued focus on hip strengthening and balance with SLS.   PT Treatment/Interventions Patient/family education;Neuromuscular re-education;Balance training;Therapeutic exercise;Therapeutic activities;Functional mobility training;Gait training;Stair training;Manual techniques;Taping;Electrical Stimulation;Moist Heat;ADLs/Self Care Home Management   PT Next Visit Plan MMT, Berg -  Assess STG's; R LE strengthing progressing to standing and CKC exercises as tolerated; Standing balance/coordination activities; Gait training working toward weaning AD   Consulted and Agree with Plan of Care Patient      Patient will benefit from skilled therapeutic intervention in order to improve the following deficits and impairments:  Decreased strength, Decreased coordination, Decreased balance, Impaired tone, Difficulty walking, Abnormal gait, Pain  Visit Diagnosis: Hemiplegia and hemiparesis following cerebral infarction affecting right dominant side (HCC)  Other lack of coordination  Muscle weakness (generalized)  Right-sided low back pain without sciatica     Problem List Patient Active Problem List   Diagnosis Date Noted  . Visit for preventive health examination 01/20/2016  . Daytime somnolence 01/20/2016  . Prostate cancer screening 01/20/2016  . Depression 04/04/2015  . Essential hypertension, benign 04/04/2015  . Chronic fatigue 04/04/2015  . History of stroke 04/04/2015  . Low serum vitamin D 04/04/2015  . Low testosterone 04/04/2015    Marry Guan, PT, MPT 03/19/2016, 12:19 PM  [facility] [address]  [address] [place], [place], [address] Phone: [phone]   Fax:  [phone]  Name: Antonio Ellison MRN: [id number] Date of Birth: [birth year]

## 2016-03-19 NOTE — Therapy (Signed)
[facility] [place] [address]  [address] [place], [place], [address] Phone: [phone]   Fax:  [phone]  Occupational Therapy Treatment  Patient Details  Name: Antonio Ellison MRN: [id number] Date of Birth: 1966-04-23 Referring Provider: Esperanza RichtersEdward Saguier PA-C  Encounter Date: 03/19/2016      OT End of Session - 03/19/16 1323    Visit Number 8   Number of Visits 17   Date for OT Re-Evaluation 04/16/16   Authorization Type [employer]   Authorization Time Period 60 visit limit (PT referral requested) - WEEK 4/8   Authorization - Visit Number 8   Authorization - Number of Visits 30   OT Start Time 1145   OT Stop Time 1225   OT Time Calculation (min) 40 min   Activity Tolerance Patient tolerated treatment well      Past Medical History:  Diagnosis Date  . Asthma   . History of chicken pox    117 Mths old  . Hyperlipidemia   . Hypertension   . Low testosterone   . Stroke [facility](HCC) 2012    Past Surgical History:  Procedure Laterality Date  . ACHILLES TENDON REPAIR     Left Leg    There were no vitals filed for this visit.      Subjective Assessment - 03/19/16 1149    Subjective  My hand is getting better   Patient is accompained by: Family member   Pertinent History h/o CVA   Patient Stated Goals To get my arms working like they used to   Currently in Pain? No/denies                      OT Treatments/Exercises (OP) - 03/19/16 0001      Fine Motor Coordination   Other Fine Motor Exercises Gross motor coordination ex's: tossing ball, catching ball, dribbling larger ball all with Rt hand with mod drops/difficulty, and throwing/catching larger ball with both hands. When standing: pt up against wall for balance.    Other Fine Motor Exercises Pre-writing ex: "Distal Finger Control" worksheet for control and coordination. Followed by writing paragraph in print with approx. 80-90% legibility                   OT Short Term Goals - 03/12/16 1347      OT SHORT TERM GOAL #1   Title Independent with initial HEP (All STG's due 03/17/16)    Time 4   Period Weeks   Status Achieved     OT SHORT TERM GOAL #2   Title Pt to perform bathing with only min assist or less   Baseline max assist   Time 4   Period Weeks   Status Achieved     OT SHORT TERM GOAL #3   Title Pt to consistently hook buttons, fastners, cut food, and tie shoes at Mod I level with A/E prn   Time 4   Period Weeks   Status Achieved  except cutting food     OT SHORT TERM GOAL #4   Title Pt to verbalize understanding with pain management strategies for neck and RUE   Time 4   Period Weeks   Status Deferred  no longer needed - pt has had no pain      OT SHORT TERM GOAL #5   Title Pt to improve RUE function as evidenced by performing 25 or greater on Box & Blocks test   Baseline eval =  18 (Lt = 45)    Time 4   Period Weeks   Status Achieved  03/12/16: 36 BLOCKS           OT Long Term Goals - 03/12/16 1348      OT LONG TERM GOAL #1   Title Independent with updated HEP (All LTG's due 04/16/16)    Time 8   Period Weeks   Status New     OT LONG TERM GOAL #2   Title Improve coordination RUE as evidenced by performing 9 hole peg test in 50 sec. or less   Baseline eval = 86.04 sec (Lt = 25.50 sec)    Time 8   Period Weeks   Status New     OT LONG TERM GOAL #3   Title Improve grip strength Rt hand to 50 lbs or greater to assist with opening jars   Baseline eval = 38 lbs (Lt = 113 lbs)    Time 8   Period Weeks   Status Achieved  03/12/16: 68 LBS     OT LONG TERM GOAL #4   Title Pt to retrieve and replace lightweight object from cabinet RUE requiring 100* shoulder flexion or >   Baseline eval = approx 40* sh. flex   Time 8   Period Weeks   Status New     OT LONG TERM GOAL #5   Title Pt to perform simple meal prep/light cleaning activities Mod I level with DME prn   Time 8   Period  Weeks   Status New               Plan - 03/19/16 1324    Clinical Impression Statement Pt with increased control during fine motor coordination activities but more difficulty with gross motor activities   Rehab Potential Good   OT Frequency 2x / week   OT Duration 8 weeks   OT Treatment/Interventions Self-care/ADL training;Therapeutic exercise;Functional Mobility Training;Patient/family education;Neuromuscular education;Splinting;Manual Therapy;DME and/or AE instruction;Therapeutic activities;Electrical Stimulation;Parrafin;Cognitive remediation/compensation;Moist Heat;Passive range of motion;Visual/perceptual remediation/compensation   Plan gripper activity, continue FM coord., UBE   Consulted and Agree with Plan of Care Patient;Family member/caregiver      Patient will benefit from skilled therapeutic intervention in order to improve the following deficits and impairments:  Decreased coordination, Decreased range of motion, Decreased endurance, Increased edema, Impaired sensation, Decreased knowledge of precautions, Decreased knowledge of use of DME, Impaired UE functional use, Pain, Decreased mobility, Decreased strength, Impaired vision/preception, Decreased cognition  Visit Diagnosis: Hemiplegia and hemiparesis following cerebral infarction affecting right dominant side (HCC)  Other lack of coordination    Problem List Patient Active Problem List   Diagnosis Date Noted  . Visit for preventive health examination 01/20/2016  . Daytime somnolence 01/20/2016  . Prostate cancer screening 01/20/2016  . Depression 04/04/2015  . Essential hypertension, benign 04/04/2015  . Chronic fatigue 04/04/2015  . History of stroke 04/04/2015  . Low serum vitamin D 04/04/2015  . Low testosterone 04/04/2015    Kelli Churn, OTR/L 03/19/2016, 1:26 PM  [facility] [address]  [address] [place], [place], [address] Phone:  [phone]   Fax:  [phone]  Name: Alekzander Cardell MRN: [id number] Date of Birth: 01/23/1966

## 2016-03-21 ENCOUNTER — Ambulatory Visit: Payer: PRIVATE HEALTH INSURANCE | Admitting: Occupational Therapy

## 2016-03-21 ENCOUNTER — Ambulatory Visit: Payer: PRIVATE HEALTH INSURANCE | Admitting: Physical Therapy

## 2016-03-21 DIAGNOSIS — I69351 Hemiplegia and hemiparesis following cerebral infarction affecting right dominant side: Secondary | ICD-10-CM | POA: Diagnosis not present

## 2016-03-21 DIAGNOSIS — M6281 Muscle weakness (generalized): Secondary | ICD-10-CM

## 2016-03-21 DIAGNOSIS — M545 Low back pain, unspecified: Secondary | ICD-10-CM

## 2016-03-21 DIAGNOSIS — R278 Other lack of coordination: Secondary | ICD-10-CM

## 2016-03-21 NOTE — Therapy (Signed)
[facility] Outpatient Rehabilitation [facility] [address]  [address] [place], [place], [address] Phone: [phone]   Fax:  [phone]  Physical Therapy Treatment  Patient Details  Name: Antonio Ellison MRN: [id number] Date of Birth: 08/07/1966 Referring Provider: Esperanza Richters, PA-C  Encounter Date: 03/21/2016      PT End of Session - 03/21/16 1100    Visit Number 9   Number of Visits 16   Date for PT Re-Evaluation 04/19/16   PT Start Time 1100   PT Stop Time 1144   PT Time Calculation (min) 44 min   Activity Tolerance Patient tolerated treatment well   Behavior During Therapy [facility] for tasks assessed/performed      Past Medical History:  Diagnosis Date  . Asthma   . History of chicken pox    44 Mths old  . Hyperlipidemia   . Hypertension   . Low testosterone   . Stroke [facility]) 2012    Past Surgical History:  Procedure Laterality Date  . ACHILLES TENDON REPAIR     Left Leg    There were no vitals filed for this visit.      Subjective Assessment - 03/21/16 1105    Subjective Pt reports KTOS stretch has relieved the buttock pain he was having last visit.   Currently in Pain? Yes          Today's Treatment  TherEx Nustep - lvl 7 x 5' B Heel raises x50 B Toe raises x50  Gait  Gait training with SPC on L x ~600 ft - cues for sequencing & reciprocal arm swing  Neuro B Side-stepping along counter top (very light UE support):   No resistance x 2 passes - cues to avoid rotating body or LE to side, cues to avoid dragging R foot with L side-step    Yellow TB x 2 passes    Red TB x 2 passes Retro gait along counter top (light single UE support):    No resistance x 4 passes - cues for step-through pattern    Yellow TB x 2 passes    Red TB x 2 passes Heel & Toe walking along counter x 3 passes each  Standing on blue foam Airex pad (no UE support):    Alt fwd step-touch to 8" step       Alt lat step-touch to 9" steps B Lateral step-ups  from blue foam Airex to 9" step           PT Education - 03/21/16 1140    Education provided Yes   Education Details Gait training with SPC on L - pt instructed to begin using cane in home and bring cane to next therapy session for height assessment   Person(s) Educated Patient   Methods Explanation;Demonstration   Comprehension Verbalized understanding;Returned demonstration          PT Short Term Goals - 03/21/16 1253      PT SHORT TERM GOAL #1   Title Independent with initial LE strengthening HEP by 03/23/16   Status Achieved     PT SHORT TERM GOAL #2   Title R LE strength grossly 3+/5 to 4-/5 for improved balance and gait stability by 03/23/16   Status On-going     PT SHORT TERM GOAL #3   Title Pt will safely ambulate on level surfaces with SPC demonstrating adequate R foot clearance to reduce risk for falls by 03/23/16   Status Achieved     PT SHORT  TERM GOAL #4   Title Pt will demonstrate reduced risk for falls as evidenced by Berg Balance Scale score >/= 45/56 by 03/23/16   Status On-going           PT Long Term Goals - 03/21/16 1253      PT LONG TERM GOAL #1   Title ISharlene Mottsndependent with advanced HEP as indicated by 04/19/16   Status On-going     PT LONG TERM GOAL #2   Title R LE strength >/= 4/5 for improved balance and gait stability by 04/19/16   Status On-going     PT LONG TERM GOAL #3   Title Pt will safely ambulate on level surfaces without AD demonstrating normal gait pattern to reduce risk for falls by 04/19/16   Status On-going     PT LONG TERM GOAL #4   Title Pt will demonstrate low risk for falls as evidenced by Sharlene MottsBerg Balance Scale score >/= 52/56 by 04/19/16   Status On-going               Plan - 03/21/16 1144    Clinical Impression Statement Pt reports buttock pain resolved with home piriformis stretches. Reviewed gait training with SPC with pt able to consistently demostrate appropriate sequencing and step through pattern using SPC on L. Pt  reports wooden cane available at home, therefore pt instructed to begin using cane at home and to bring cane to next therapy visit for height assessment.   PT Treatment/Interventions Patient/family education;Neuromuscular re-education;Balance training;Therapeutic exercise;Therapeutic activities;Functional mobility training;Gait training;Stair training;Manual techniques;Taping;Electrical Stimulation;Moist Heat;ADLs/Self Care Home Management   PT Next Visit Plan MMT, Berg - Assess associated STG's; R LE strengthing progressing to standing and CKC exercises as tolerated; Standing balance/coordination activities; Gait training working toward weaning AD   Consulted and Agree with Plan of Care Patient      Patient will benefit from skilled therapeutic intervention in order to improve the following deficits and impairments:  Decreased strength, Decreased coordination, Decreased balance, Impaired tone, Difficulty walking, Abnormal gait, Pain  Visit Diagnosis: Hemiplegia and hemiparesis following cerebral infarction affecting right dominant side (HCC)  Other lack of coordination  Muscle weakness (generalized)  Right-sided low back pain without sciatica     Problem List Patient Active Problem List   Diagnosis Date Noted  . Visit for preventive health examination 01/20/2016  . Daytime somnolence 01/20/2016  . Prostate cancer screening 01/20/2016  . Depression 04/04/2015  . Essential hypertension, benign 04/04/2015  . Chronic fatigue 04/04/2015  . History of stroke 04/04/2015  . Low serum vitamin D 04/04/2015  . Low testosterone 04/04/2015    Marry GuanJoAnne M Lesslie Mossa, PT, MPT 03/21/2016, 12:55 PM  [facility] [address]  [address] [place], [place], [address] Phone: [phone]   Fax:  [phone]  Name: Antonio Ellison MRN: [id number] Date of Birth: [birth year]

## 2016-03-21 NOTE — Therapy (Signed)
[facility] [address]  [address] [place], [place], [address] Phone: [phone]   Fax:  [phone]  Occupational Therapy Treatment  Patient Details  Name: Antonio Ellison MRN: [id number] Date of Birth: 22-Mar-1966 Referring Provider: Esperanza Richters PA-C  Encounter Date: 03/21/2016      OT End of Session - 03/21/16 1221    Visit Number 9   Number of Visits 17   Date for OT Re-Evaluation 04/16/16   Authorization Type [employer]   Authorization Time Period 60 visit limit (PT referral requested) - WEEK 4/8   Authorization - Visit Number 9   Authorization - Number of Visits 30   OT Start Time 1145   OT Stop Time 1225   OT Time Calculation (min) 40 min   Activity Tolerance Patient tolerated treatment well      Past Medical History:  Diagnosis Date  . Asthma   . History of chicken pox    22 Mths old  . Hyperlipidemia   . Hypertension   . Low testosterone   . Stroke [facility]) 2012    Past Surgical History:  Procedure Laterality Date  . ACHILLES TENDON REPAIR     Left Leg    There were no vitals filed for this visit.      Subjective Assessment - 03/21/16 1145    Subjective  The tingling is getting better in my hand   Pertinent History h/o CVA   Patient Stated Goals To get my arms working like they used to   Currently in Pain? No/denies                      OT Treatments/Exercises (OP) - 03/21/16 0001      Shoulder Exercises: ROM/Strengthening   Other ROM/Strengthening Exercises UBE x 10 min. Level 5 for reciprocal movement pattern and strength/endurance     Hand Exercises   Other Hand Exercises Gripper set at 35 lbs. resistance to pick up blocks with Rt hand for sustained grip strength, control and coordination. Pt with max difficulty and drops near end of task d/t fatigue. Pt also lacked control in wrist to maintain neutral or slight extension, as pt would go into wrist flexion.      Fine Motor  Coordination   Small Pegboard Pt placing small pegs in pegboard 3-5 at a time for in hand manipulation with mod drops and difficulty. Pt also seemed to have more difficulty following peg design and requiring min cues while following instructions for in hand manipulation (requires more planning ahead)                   OT Short Term Goals - 03/12/16 1347      OT SHORT TERM GOAL #1   Title Independent with initial HEP (All STG's due 03/17/16)    Time 4   Period Weeks   Status Achieved     OT SHORT TERM GOAL #2   Title Pt to perform bathing with only min assist or less   Baseline max assist   Time 4   Period Weeks   Status Achieved     OT SHORT TERM GOAL #3   Title Pt to consistently hook buttons, fastners, cut food, and tie shoes at Mod I level with A/E prn   Time 4   Period Weeks   Status Achieved  except cutting food     OT SHORT TERM GOAL #4   Title Pt to verbalize  understanding with pain management strategies for neck and RUE   Time 4   Period Weeks   Status Deferred  no longer needed - pt has had no pain      OT SHORT TERM GOAL #5   Title Pt to improve RUE function as evidenced by performing 25 or greater on Box & Blocks test   Baseline eval = 18 (Lt = 45)    Time 4   Period Weeks   Status Achieved  03/12/16: 36 BLOCKS           OT Long Term Goals - 03/12/16 1348      OT LONG TERM GOAL #1   Title Independent with updated HEP (All LTG's due 04/16/16)    Time 8   Period Weeks   Status New     OT LONG TERM GOAL #2   Title Improve coordination RUE as evidenced by performing 9 hole peg test in 50 sec. or less   Baseline eval = 86.04 sec (Lt = 25.50 sec)    Time 8   Period Weeks   Status New     OT LONG TERM GOAL #3   Title Improve grip strength Rt hand to 50 lbs or greater to assist with opening jars   Baseline eval = 38 lbs (Lt = 113 lbs)    Time 8   Period Weeks   Status Achieved  03/12/16: 68 LBS     OT LONG TERM GOAL #4   Title Pt to  retrieve and replace lightweight object from cabinet RUE requiring 100* shoulder flexion or >   Baseline eval = approx 40* sh. flex   Time 8   Period Weeks   Status New     OT LONG TERM GOAL #5   Title Pt to perform simple meal prep/light cleaning activities Mod I level with DME prn   Time 8   Period Weeks   Status New               Plan - 03/21/16 1222    Clinical Impression Statement Pt with increased in hand manipulation Rt hand, however sustained grip strength and control of Rt hand remains difficult.    Rehab Potential Good   OT Frequency 2x / week   OT Duration 8 weeks   OT Treatment/Interventions Self-care/ADL training;Therapeutic exercise;Functional Mobility Training;Patient/family education;Neuromuscular education;Splinting;Manual Therapy;DME and/or AE instruction;Therapeutic activities;Electrical Stimulation;Parrafin;Cognitive remediation/compensation;Moist Heat;Passive range of motion;Visual/perceptual remediation/compensation   Plan RUE reaching with light weight   Consulted and Agree with Plan of Care Patient      Patient will benefit from skilled therapeutic intervention in order to improve the following deficits and impairments:  Decreased coordination, Decreased range of motion, Decreased endurance, Increased edema, Impaired sensation, Decreased knowledge of precautions, Decreased knowledge of use of DME, Impaired UE functional use, Pain, Decreased mobility, Decreased strength, Impaired vision/preception, Decreased cognition  Visit Diagnosis: Hemiplegia and hemiparesis following cerebral infarction affecting right dominant side (HCC)  Other lack of coordination  Muscle weakness (generalized)    Problem List Patient Active Problem List   Diagnosis Date Noted  . Visit for preventive health examination 01/20/2016  . Daytime somnolence 01/20/2016  . Prostate cancer screening 01/20/2016  . Depression 04/04/2015  . Essential hypertension, benign 04/04/2015   . Chronic fatigue 04/04/2015  . History of stroke 04/04/2015  . Low serum vitamin D 04/04/2015  . Low testosterone 04/04/2015    Kelli Churn, OTR/L 03/21/2016, 12:24 PM  [facility] [address]  [address] [place], [place], [address] Phone: [phone]   Fax:  [phone]  Name: Antonio Ellison MRN: [id number] Date of Birth: [birth year]

## 2016-03-26 ENCOUNTER — Ambulatory Visit (INDEPENDENT_AMBULATORY_CARE_PROVIDER_SITE_OTHER): Payer: PRIVATE HEALTH INSURANCE | Admitting: Neurology

## 2016-03-26 ENCOUNTER — Encounter: Payer: Self-pay | Admitting: Neurology

## 2016-03-26 ENCOUNTER — Ambulatory Visit: Payer: PRIVATE HEALTH INSURANCE | Admitting: Physical Therapy

## 2016-03-26 ENCOUNTER — Other Ambulatory Visit (INDEPENDENT_AMBULATORY_CARE_PROVIDER_SITE_OTHER): Payer: PRIVATE HEALTH INSURANCE

## 2016-03-26 VITALS — BP 110/78 | HR 72 | Temp 98.4°F | Ht 73.0 in | Wt 236.0 lb

## 2016-03-26 DIAGNOSIS — E669 Obesity, unspecified: Secondary | ICD-10-CM

## 2016-03-26 DIAGNOSIS — I69351 Hemiplegia and hemiparesis following cerebral infarction affecting right dominant side: Secondary | ICD-10-CM

## 2016-03-26 DIAGNOSIS — M6289 Other specified disorders of muscle: Secondary | ICD-10-CM

## 2016-03-26 DIAGNOSIS — I6319 Cerebral infarction due to embolism of other precerebral artery: Secondary | ICD-10-CM | POA: Diagnosis not present

## 2016-03-26 DIAGNOSIS — R278 Other lack of coordination: Secondary | ICD-10-CM

## 2016-03-26 DIAGNOSIS — R531 Weakness: Secondary | ICD-10-CM

## 2016-03-26 DIAGNOSIS — M545 Low back pain, unspecified: Secondary | ICD-10-CM

## 2016-03-26 DIAGNOSIS — M6281 Muscle weakness (generalized): Secondary | ICD-10-CM

## 2016-03-26 DIAGNOSIS — I639 Cerebral infarction, unspecified: Secondary | ICD-10-CM | POA: Insufficient documentation

## 2016-03-26 LAB — COMPREHENSIVE METABOLIC PANEL WITH GFR
ALT: 24 U/L (ref 0–53)
AST: 19 U/L (ref 0–37)
Albumin: 4.3 g/dL (ref 3.5–5.2)
Alkaline Phosphatase: 98 U/L (ref 39–117)
BUN: 17 mg/dL (ref 6–23)
CO2: 27 meq/L (ref 19–32)
Calcium: 9.8 mg/dL (ref 8.4–10.5)
Chloride: 103 meq/L (ref 96–112)
Creatinine, Ser: 1.27 mg/dL (ref 0.40–1.50)
GFR: 76.94 mL/min
Glucose, Bld: 96 mg/dL (ref 70–99)
Potassium: 3.9 meq/L (ref 3.5–5.1)
Sodium: 137 meq/L (ref 135–145)
Total Bilirubin: 0.4 mg/dL (ref 0.2–1.2)
Total Protein: 7.8 g/dL (ref 6.0–8.3)

## 2016-03-26 NOTE — Therapy (Signed)
[facility] [address]  [place], [place], [address] Phone: [phone]   Fax:  [phone]  Physical Therapy Treatment  Patient Details  Name: Antonio Ellison MRN: [id number] Date of Birth: Aug 18, 1965 Referring Provider: Mackie Pai, PA-C  Encounter Date: 03/26/2016      PT End of Session - 03/26/16 1315    Visit Number 10   Number of Visits 16   Date for PT Re-Evaluation 04/19/16   PT Start Time 7564   PT Stop Time 1400   PT Time Calculation (min) 45 min   Activity Tolerance Patient tolerated treatment well   Behavior During Therapy [facility] for tasks assessed/performed      Past Medical History:  Diagnosis Date  . Asthma   . History of chicken pox    48 Mths old  . Hyperlipidemia   . Hypertension   . Low testosterone   . Stroke [facility]) 2012    Past Surgical History:  Procedure Laterality Date  . ACHILLES TENDON REPAIR     Left Leg    There were no vitals filed for this visit.      Subjective Assessment - 03/26/16 1315    Subjective Pt saw his neurologist today and states she has him scheduled for several tests. Pt continues to c/o chronic fatigue.   Patient Stated Goals "walk w/o a walker"   Currently in Pain? No/denies            [facility] PT Assessment - 03/26/16 1315      Assessment   Medical Diagnosis CVA - R hemiparesis   Referring Provider Mackie Pai, PA-C   Onset Date/Surgical Date 01/29/16   Hand Dominance Right   Next MD Visit none scheduled     Observation/Other Assessments   Focus on Therapeutic Outcomes (FOTO)  Neuromuscular disorder 40% (60% limitation)     Strength   Strength Assessment Site Hip;Knee;Ankle   Right Hip Flexion 4/5   Right Hip Extension 4/5   Right Hip ABduction 4/5   Right Hip ADduction 4/5   Left Hip Flexion 5/5   Left Hip Extension 5/5   Left Hip ABduction 5/5   Left Hip ADduction 5/5   Right Knee Flexion 4/5   Right Knee Extension 4+/5   Left  Knee Flexion 5/5   Left Knee Extension 5/5   Right Ankle Dorsiflexion 4/5   Right Ankle Plantar Flexion 4+/5   Right Ankle Inversion 4-/5   Right Ankle Eversion 4-/5   Left Ankle Dorsiflexion 5/5   Left Ankle Plantar Flexion 5/5   Left Ankle Inversion 5/5   Left Ankle Eversion 5/5     Standardized Balance Assessment   Standardized Balance Assessment Berg Balance Test;Timed Up and Go Test;Five Times Sit to Stand;10 meter walk test   Five times sit to stand comments  8.75"   10 Meter Walk 2.46 ft/sec  13.34 sec w/o AD     Berg Balance Test   Sit to Stand Able to stand without using hands and stabilize independently   Standing Unsupported Able to stand safely 2 minutes   Sitting with Back Unsupported but Feet Supported on Floor or Stool Able to sit safely and securely 2 minutes   Stand to Sit Sits safely with minimal use of hands   Transfers Able to transfer safely, minor use of hands   Standing Unsupported with Eyes Closed Able to stand 10 seconds safely   Standing Ubsupported with Feet Together Able to place  feet together independently and stand 1 minute safely   From Standing, Reach Forward with Outstretched Arm Can reach forward >12 cm safely (5")   From Standing Position, Pick up Object from [place] to pick up shoe safely and easily   From Standing Position, Turn to Look Behind Over each Shoulder Looks behind from both sides and weight shifts well   Turn 360 Degrees Able to turn 360 degrees safely in 4 seconds or less   Standing Unsupported, Alternately Place Feet on Step/Stool Able to stand independently and safely and complete 8 steps in 20 seconds   Standing Unsupported, One Foot in [place] to place foot tandem independently and hold 30 seconds   Standing on One Leg Able to lift leg independently and hold equal to or more than 3 seconds   Total Score 53     Timed Up and Go Test   TUG Normal TUG   Normal TUG (seconds) 9.75  w/o AD          Today's  Treatment  TherEx Rec Bike - lvl 3 x 5'  MMT  Berg 5x STS 10 MWT TUG  Gait  Gait training with SPC on L x ~1000 ft - cues for sequencing & reciprocal arm swing Stair training with SPC and R rail initially with step-to pattern, progressing to reciprocal pattern; CGA/SBA of PT          PT Short Term Goals - 03/26/16 1341      PT SHORT TERM GOAL #1   Title Independent with initial LE strengthening HEP by 03/23/16   Status Achieved     PT SHORT TERM GOAL #2   Title R LE strength grossly 3+/5 to 4-/5 for improved balance and gait stability by 03/23/16   Status Achieved     PT SHORT TERM GOAL #3   Title Pt will safely ambulate on level surfaces with SPC demonstrating adequate R foot clearance to reduce risk for falls by 03/23/16   Status Achieved     PT SHORT TERM GOAL #4   Title Pt will demonstrate reduced risk for falls as evidenced by Merrilee Jansky Balance Scale score >/= 45/56 by 03/23/16   Status Achieved           PT Long Term Goals - 03/26/16 1344      PT LONG TERM GOAL #1   Title Independent with advanced HEP as indicated by 04/19/16   Status On-going     PT LONG TERM GOAL #2   Title R LE strength >/= 4/5 for improved balance and gait stability by 04/19/16   Status Partially Met  Met except ankle inversion/eversion 4-/5     PT LONG TERM GOAL #3   Title Pt will safely ambulate on level surfaces without AD demonstrating normal gait pattern to reduce risk for falls by 04/19/16   Status On-going     PT LONG TERM GOAL #4   Title Pt will demonstrate low risk for falls as evidenced by Merrilee Jansky Balance Scale score >/= 52/56 by 04/19/16   Status Achieved  Berg = 53/56               Plan - 03/26/16 1400    Clinical Impression Statement Pt has demonstrated excellent progress to date with PT. R LE strength has improved by an average of at least 1 muscle grade with majority of R LE strength >/= 4/5 excepting R ankle inversion/eversion at 4-/5. Pt has demonstrated exceptional  improvement in balance per  Berg Balance test from 39/56 on eval to 53/56 currently. Pt is able to safely ambulate with SPC and has been encouraged to use cane in place of RW unless walking for extended periods. All STG's met and pt demonstrating good progress with LTG's.   PT Treatment/Interventions Patient/family education;Neuromuscular re-education;Balance training;Therapeutic exercise;Therapeutic activities;Functional mobility training;Gait training;Stair training;Manual techniques;Taping;Electrical Stimulation;Moist Heat;ADLs/Self Care Home Management   PT Next Visit Plan R LE strengthing with continued progression of standing, proprioception and CKC exercises as tolerated; Standing balance/coordination activities; Gait training working toward weaning AD   Consulted and Agree with Plan of Care Patient;Family member/caregiver   Family Member Consulted mother      Patient will benefit from skilled therapeutic intervention in order to improve the following deficits and impairments:  Decreased strength, Decreased coordination, Decreased balance, Impaired tone, Difficulty walking, Abnormal gait, Pain  Visit Diagnosis: Hemiplegia and hemiparesis following cerebral infarction affecting right dominant side (HCC)  Other lack of coordination  Muscle weakness (generalized)  Right-sided low back pain without sciatica     Problem List Patient Active Problem List   Diagnosis Date Noted  . Right sided weakness 03/26/2016  . 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 Obesity 03/26/2016  . Visit for preventive health examination 01/20/2016  . Daytime somnolence 01/20/2016  . Prostate cancer screening 01/20/2016  . Depression 04/04/2015  . Essential hypertension, benign 04/04/2015  . Chronic fatigue 04/04/2015  . History of stroke 04/04/2015  . Low serum vitamin D 04/04/2015  . Low testosterone 04/04/2015    Percival Spanish, PT, MPT 03/26/2016, 3:25 PM  [facility] [address]  [place], [place], [address] Phone: [phone]   Fax:  [phone]  Name: Antonio Ellison MRN: [id number] Date of Birth: [birth year]

## 2016-03-26 NOTE — Progress Notes (Signed)
NEUROLOGY CONSULTATION NOTE  Antonio Ellison MRN: [id number] DOB: 12-28-1965  Referring provider: Mackie Pai, PA-C Primary care provider: Raiford Noble, PA-C  Reason for consult:  Stroke follow-up   Thank you for your kind referral of Antonio Ellison for consultation of the above symptoms. Although his history is well known to you, please allow me to reiterate it for the purpose of our medical record. The patient was accompanied to the clinic by his mother who also provides collateral information. Records and images were personally reviewed where available.  HISTORY OF PRESENT ILLNESS: This is a 50 year old right-handed man with a history of hypertension, hyperlipidemia, stroke at age 25 affecting his left side with minimal residual deficits (walking with a limp), who was admitted for a stroke at [facility] last 01/29/16. He presented for dizziness and stumbling for several days. He reported left arm tingling. He was evaluated by Neurology, exam showed 5/5 strength with some giveway weakness in the left leg. MRI brain reported low level increased signal on DWI in the right frontal periventricular white matter 53m, without appreciable increase or decrease in ADC value, this may represent a subacute deep white matter infarct. There was premature for age cerebral and cerebellar atrophy, subcortical periventricular white matter signal abnroamltiy favored to represent chronic microvascular ischemic change. There are an unusually large number of lacunar infarcts in the deep nuclei and periventricular white matter for patient's age, consistent with multiple chronic cerebrovascular insults. Compared to MRI in 2012, cerebrovascular disease has progressed. PT notes on HD 2 indicated 5/5 strength throughout, he was reporting burning/tingling in the left hand/arm and left leg and foot. He was On HD 3, he was evaluated by OT which indicated bilaterally UE MMT 4/5 grossly, symmetrical, within  functional limits. Bilateral gross motor coordination mildly limited during ADL tasks. He was reporting numbness and tingling on the left side of his body. He was discharged the next day, and reports that as he was leaving, he noticed his right side had constricted movement, he could not lift his arm or open his hand, he could not lift his right leg to get into the car. It felt like ants were running through his right arm. He did not appear to report these symptoms at the hospital. Since hospital discharge, he has been going for PT for his right side. He has gained some movement in his right UE and LE, and the paresthesias are now localized in the inner distal forearm. His left side feels fine. He recalls that a few days prior to his hospitalization, he felt like he was split down the middle, he had decreased sensation on his right side, not feeling a cold soda in his right hand, no weakness at that time. As part of stroke workup, he had an MRA head and neck which were unremarkable. Echocardiogram showed mild concentric LVH, left atrium normal size, negative bubble study. Hypercoagulable workup was negative except for positive ANA with 1:80 titer, speckled pattern. ESR and CRP normal. HbA1c was 6.1, lipid panel showed total cholesterol 218, LDL 157. He was discharged home on Plavix and atorvastatin.   He has a history of stroke at age 50 He reports his right side was affected, however per hospital records, his wife reported left-sided symptoms. He mostly recovered from his except for a slight limp and memory changes. His mother reports both strokes have affected his thinking, changed his whole demeanor. 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 He worries a lot because he is unable to provide for his family. 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 He  continues to have daytime drowsiness. He denies any falls but stumbles a lot. He uses his walker now to ambulate. He reports good sleep at night. He frequently complains of a headache, mostly on the frontal region radiating to the back, with some dizziness, no nausea/vomiting, no vision changes. He feels this was associated with his shoulder pains. His father and maternal grandmother had strokes.   PAST MEDICAL HISTORY: Past Medical History:  Diagnosis Date  . Asthma   . History of chicken pox    68 Mths old  . Hyperlipidemia   . Hypertension   . Low testosterone   . Stroke [facility]) 2012    PAST SURGICAL HISTORY: Past Surgical History:  Procedure Laterality Date  . ACHILLES TENDON REPAIR     Left Leg    MEDICATIONS: Current Outpatient Prescriptions on File Prior to Visit  Medication Sig Dispense Refill  . amLODipine (NORVASC) 10 MG tablet Take 1 tablet (10 mg total) by mouth daily. 30 tablet 5  . atorvastatin (LIPITOR) 80 MG tablet Take 1 tablet (80 mg total) by mouth daily. 30 tablet 5  . buPROPion (WELLBUTRIN SR) 150 MG 12 hr tablet Take 1 tablet (150 mg total) by mouth 2 (two) times daily. 180 tablet 1  . clopidogrel (PLAVIX) 75 MG tablet Take 75 mg by mouth daily.    . cyclobenzaprine (FLEXERIL) 5 MG tablet Take 5 mg by mouth 3 (three) times daily as needed for muscle spasms.    . fexofenadine (ALLEGRA) 180 MG tablet Take 180 mg by mouth daily as needed for allergies or rhinitis. Reported on 11/16/2015    . lisinopril (PRINIVIL,ZESTRIL) 10 MG tablet Take 1 tablet (10 mg total) by mouth daily. 30 tablet 3  . sertraline (ZOLOFT) 50 MG tablet Take 1 tablet (50 mg total) by mouth daily. 90 tablet 1   No current facility-administered medications on file prior to visit.     ALLERGIES: No Known Allergies  FAMILY HISTORY: Family History  Problem Relation Age of Onset  . Hypertension Father   . Stroke Father     Late 46s  . Diabetes Father   . Sinusitis Mother   . Kidney disease  Maternal Aunt   . Hypertension Maternal Aunt   . Liver cancer Maternal Aunt   . Hypertension Paternal Grandmother   . Aneurysm Paternal Grandmother   . Hypertension Maternal Grandfather   . Alzheimer's disease Maternal Grandmother   . Heart disease Maternal Grandmother   . Hypertension Paternal Grandfather   . Stroke Paternal Grandfather   . Healthy Sister   . Healthy Daughter   . Healthy Son     SOCIAL HISTORY: Social History   Social History  . Marital status: Married    Spouse name: N/A  . Number of children: N/A  . Years of education: N/A   Occupational History  . Not on file.   Social History Main Topics  . Smoking status: Never Smoker  . Smokeless tobacco: Never Used  . Alcohol use No     Comment: Former Use  . Drug use: No  . Sexual activity: Not Currently   Other Topics Concern  . Not on file   Social History Narrative  . No narrative on file  REVIEW OF SYSTEMS: Constitutional: No fevers, chills, or sweats, no generalized fatigue, change in appetite Eyes: No visual changes, double vision, eye pain Ear, nose and throat: No hearing loss, ear pain, nasal congestion, sore throat Cardiovascular: No chest pain, palpitations Respiratory:  No shortness of breath at rest or with exertion, wheezes GastrointestinaI: No nausea, vomiting, diarrhea, abdominal pain, fecal incontinence Genitourinary:  No dysuria, urinary retention or frequency Musculoskeletal:  No neck pain, back pain Integumentary: No rash, pruritus, skin lesions Neurological: as above Psychiatric: + depression, anxiety Endocrine: No palpitations, fatigue, diaphoresis, mood swings, change in appetite, change in weight, increased thirst Hematologic/Lymphatic:  No anemia, purpura, petechiae. Allergic/Immunologic: no itchy/runny eyes, nasal congestion, recent allergic reactions, rashes  PHYSICAL EXAM: Vitals:   03/26/16 0844  BP: 110/78  Pulse: 72  Temp: 98.4 F (36.9 C)   General: No acute  distress, flat affect with poor eye contact Head:  Normocephalic/atraumatic Eyes: Fundoscopic exam shows bilateral sharp discs, no vessel changes, exudates, or hemorrhages Neck: supple, no paraspinal tenderness, full range of motion Back: No paraspinal tenderness Heart: regular rate and rhythm Lungs: Clear to auscultation bilaterally. Vascular: No carotid bruits. Skin/Extremities: No rash, no edema Neurological Exam: Mental status: alert and oriented to person, place, and time, no dysarthria or aphasia, Fund of knowledge is appropriate.  Recent and remote memory are intact. 2/3 delayed recall.  Attention and concentration are normal.    Able to name objects and repeat phrases. Cranial nerves: CN I: not tested CN II: pupils equal, round and reactive to light, visual fields intact, fundi unremarkable. CN III, IV, VI:  full range of motion, no nystagmus, no ptosis CN V: decreased cold and pin on right V1-3 CN VII: upper and lower face symmetric CN VIII: hearing intact to finger rub CN IX, X: gag intact, uvula midline CN XI: sternocleidomastoid and trapezius muscles intact CN XII: tongue midline Bulk & Tone: normal, no fasciculations. Motor: 4/5 right wrist and finger extensors, right hip flexors, otherwise 5/5, +mild right pronator drift, orbits around the right arm Sensation: decreased pin and cold on right UE and LE, intact vibration and joint position sense.  Romberg test negative Deep Tendon Reflexes: +1 both UE, +3 left patella, +2 right patella, absent ankle jerks bilaterally, no ankle clonus Plantar responses: mute Cerebellar: no incoordination on finger to nose testing Gait: slow and cautious with walker, favoring right leg Tremor: none  IMPRESSION: This is a 50 year old right-handed man with  hypertension, hyperlipidemia, stroke at age 26 affecting his left side with minimal residual deficits (walking with a limp), who was admitted for a stroke at [facility]  last 01/29/16 for stroke. At that time, his main symptom was dizziness and left-sided paresthesias. MRI brain had shown possible right frontal subacute deep white matter infarct. There was premature for age cerebral and cerebellar atrophy, subcortical periventricular white matter signal abnroamltiy favored to represent chronic microvascular ischemic change. There are an unusually large number of lacunar infarcts in the deep nuclei and periventricular white matter for patient's age, consistent with multiple chronic cerebrovascular insults. He presents for hospital follow-up with right-sided weakness that apparently started on hospital discharge. Repeat MRI brain with and without contrast will be ordered. The etiology of multiple strokes is unclear, hypercoagulable workup negative, MRA head/neck unremarkable, possibly due to small vessel disease, however would proceed with 30-day holter to assess for atrial fibrillation. Continue control of vascular risk factors, Plavix, and physical therapy. He would like to continue with weight loss  and is interested in seeing a nutritionist. He is depressed and expressed anger/frustration with his situation. Consideration for seeing a psychiatrist and a different therapist was advised. He will think about this. He will follow-up in 3 months and knows to go to the ER if symptoms change or worsen.   Thank you for allowing me to participate in the care of this patient. Please do not hesitate to call for any questions or concerns.   Ellouise Newer, M.D.  CC: Raiford Noble, Mackie Pai

## 2016-03-26 NOTE — Patient Instructions (Signed)
1. Schedule MRI brain with and without contrast 2. Schedule 30-day holter monitor 3. Refer to Nutritionist for weight loss strategies 4. Continue all your medications 5. If symptoms change, go to ER immediately 6. Follow-up in 3 months, call for any changes

## 2016-03-28 ENCOUNTER — Ambulatory Visit: Payer: PRIVATE HEALTH INSURANCE | Admitting: Physical Therapy

## 2016-03-28 DIAGNOSIS — M545 Low back pain, unspecified: Secondary | ICD-10-CM

## 2016-03-28 DIAGNOSIS — R278 Other lack of coordination: Secondary | ICD-10-CM

## 2016-03-28 DIAGNOSIS — M6281 Muscle weakness (generalized): Secondary | ICD-10-CM

## 2016-03-28 DIAGNOSIS — I69351 Hemiplegia and hemiparesis following cerebral infarction affecting right dominant side: Secondary | ICD-10-CM

## 2016-03-28 NOTE — Therapy (Signed)
[facility] [address]  [place], [place], [address] Phone: [phone]   Fax:  [phone]  Physical Therapy Treatment  Patient Details  Name: Antonio Ellison MRN: [id number] Date of Birth: 05/28/1966 Referring Provider: Mackie Pai, PA-C  Encounter Date: 03/28/2016      PT End of Session - 03/28/16 1100    Visit Number 11   Number of Visits 16   Date for PT Re-Evaluation 04/19/16   PT Start Time 1100   PT Stop Time 1142   PT Time Calculation (min) 42 min   Activity Tolerance Patient tolerated treatment well   Behavior During Therapy [facility] for tasks assessed/performed      Past Medical History:  Diagnosis Date  . Asthma   . History of chicken pox    62 Mths old  . Hyperlipidemia   . Hypertension   . Low testosterone   . Stroke [facility]) 2012    Past Surgical History:  Procedure Laterality Date  . ACHILLES TENDON REPAIR     Left Leg    There were no vitals filed for this visit.      Subjective Assessment - 03/28/16 1104    Subjective Pt presents to PT using SPC today and reports no issues with cane so far.   Patient Stated Goals "walk w/o a walker"   Currently in Pain? No/denies           Today's Treatment  TherEx Rec Bike - lvl 4 x 5'  Gait  Gait training with SPC on L x 10 min over varying terrain including indoor/outdoor, level surfaces, uneven/grassy areas, paved and grassy inclines, and up/down curb - cues for consistent good foot clearance on all surfaces  Stair training 2 flights with SPC and R rail with reciprocal pattern; CGA/SBA of PT  TherEx Seated march with black TB 2x10 Bridge + Alt Hip ABD/ER with black TB 15x3" B Sidelying clam with black TB x15  Neuro B Side-stepping over 1/2 FR with TRX support x10 Alt backward lunge with TRX support x10          PT Education - 03/28/16 1144    Education provided Yes   Education Details HEP updated to black TB   Person(s) Educated Patient   Methods Explanation;Demonstration   Comprehension Verbalized understanding;Returned demonstration          PT Short Term Goals - 03/26/16 1341      PT SHORT TERM GOAL #1   Title Independent with initial LE strengthening HEP by 03/23/16   Status Achieved     PT SHORT TERM GOAL #2   Title R LE strength grossly 3+/5 to 4-/5 for improved balance and gait stability by 03/23/16   Status Achieved     PT SHORT TERM GOAL #3   Title Pt will safely ambulate on level surfaces with SPC demonstrating adequate R foot clearance to reduce risk for falls by 03/23/16   Status Achieved     PT SHORT TERM GOAL #4   Title Pt will demonstrate reduced risk for falls as evidenced by Merrilee Jansky Balance Scale score >/= 45/56 by 03/23/16   Status Achieved           PT Long Term Goals - 03/26/16 1344      PT LONG TERM GOAL #1   Title Independent with advanced HEP as indicated by 04/19/16   Status On-going     PT LONG TERM GOAL #2   Title R LE  strength >/= 4/5 for improved balance and gait stability by 04/19/16   Status Partially Met  Met except ankle inversion/eversion 4-/5     PT LONG TERM GOAL #3   Title Pt will safely ambulate on level surfaces without AD demonstrating normal gait pattern to reduce risk for falls by 04/19/16   Status On-going     PT LONG TERM GOAL #4   Title Pt will demonstrate low risk for falls as evidenced by Merrilee Jansky Balance Scale score >/= 52/56 by 04/19/16   Status Achieved  Berg = 53/56               Plan - 03/28/16 1145    Clinical Impression Statement Pt arrived to PT using SPC today and reports no issues or concerns with use of cane. Provided gait training on uneven surfaces with cues necessary for imcreased foot clearance to reduce tripping hazard. Upgraded TB to black for HEP.    PT Treatment/Interventions Patient/family education;Neuromuscular re-education;Balance training;Therapeutic exercise;Therapeutic activities;Functional mobility  training;Gait training;Stair training;Manual techniques;Taping;Electrical Stimulation;Moist Heat;ADLs/Self Care Home Management   PT Next Visit Plan R LE strengthing with continued progression of standing, proprioception and CKC exercises as tolerated; Standing balance/coordination activities; Gait training working toward weaning AD   Consulted and Agree with Plan of Care Patient      Patient will benefit from skilled therapeutic intervention in order to improve the following deficits and impairments:  Decreased strength, Decreased coordination, Decreased balance, Impaired tone, Difficulty walking, Abnormal gait, Pain  Visit Diagnosis: Hemiplegia and hemiparesis following cerebral infarction affecting right dominant side (HCC)  Other lack of coordination  Muscle weakness (generalized)  Right-sided low back pain without sciatica     Problem List Patient Active Problem List   Diagnosis Date Noted  . Right sided weakness 03/26/2016  . Cerebrovascular accident (CVA) due to embolism ([place]) 03/26/2016  . Obesity 03/26/2016  . Visit for preventive health examination 01/20/2016  . Daytime somnolence 01/20/2016  . Prostate cancer screening 01/20/2016  . Depression 04/04/2015  . Essential hypertension, benign 04/04/2015  . Chronic fatigue 04/04/2015  . History of stroke 04/04/2015  . Low serum vitamin D 04/04/2015  . Low testosterone 04/04/2015    Percival Spanish, PT, MPT 03/28/2016, 11:51 AM  [facility] [address]  [place], [place], [address] Phone: [phone]   Fax:  [phone]  Name: Booker Bhatnagar MRN: [id number] Date of Birth: [birth year]

## 2016-04-02 ENCOUNTER — Ambulatory Visit: Payer: PRIVATE HEALTH INSURANCE | Admitting: Physical Therapy

## 2016-04-02 DIAGNOSIS — I69351 Hemiplegia and hemiparesis following cerebral infarction affecting right dominant side: Secondary | ICD-10-CM

## 2016-04-02 DIAGNOSIS — M545 Low back pain, unspecified: Secondary | ICD-10-CM

## 2016-04-02 DIAGNOSIS — R278 Other lack of coordination: Secondary | ICD-10-CM

## 2016-04-02 DIAGNOSIS — M6281 Muscle weakness (generalized): Secondary | ICD-10-CM

## 2016-04-02 NOTE — Therapy (Signed)
[facility] [address]  [place], [place], [address] Phone: [phone]   Fax:  [phone]  Physical Therapy Treatment  Patient Details  Name: Antonio Ellison MRN: [id number] Date of Birth: November 10, 1965 Referring Provider: Mackie Pai, PA-C  Encounter Date: 04/02/2016      PT End of Session - 04/02/16 1106    Visit Number 12   Number of Visits 16   Date for PT Re-Evaluation 04/19/16   PT Start Time 1106   PT Stop Time 1148   PT Time Calculation (min) 42 min   Activity Tolerance Patient tolerated treatment well   Behavior During Therapy [facility] for tasks assessed/performed      Past Medical History:  Diagnosis Date  . Asthma   . History of chicken pox    33 Mths old  . Hyperlipidemia   . Hypertension   . Low testosterone   . Stroke [facility]) 2012    Past Surgical History:  Procedure Laterality Date  . ACHILLES TENDON REPAIR     Left Leg    There were no vitals filed for this visit.      Subjective Assessment - 04/02/16 1106    Subjective (P)  Pt presents to PT using SPC today and reports no issues with cane so far.   Patient Stated Goals (P)  "walk w/o a walker"   Currently in Pain? (P)  No/denies           Today's Treatment  TherEx Rec Bike - lvl 4 x 5'  Neuro/Dynamic Gait (w/o AD, occasional reach for wall for LOB) Varying cadence - fast/slow Visual scanning - L/R, up/down Sudden stop/starts 180 dg turn Turn & stop Retro gait High stepping Tandem gait - fwd/back Side-stepping Braiding/grapevine  Neuro Alt fwd lunge onto BOSU (up) with contralateral reach to cone on top of vertical 6"x36" FR SLS on BOSU (up) with 2 pole A, CGA of PT           PT Short Term Goals - 03/26/16 1341      PT SHORT TERM GOAL #1   Title Independent with initial LE strengthening HEP by 03/23/16   Status Achieved     PT SHORT TERM GOAL #2   Title R LE strength grossly 3+/5 to 4-/5 for improved  balance and gait stability by 03/23/16   Status Achieved     PT SHORT TERM GOAL #3   Title Pt will safely ambulate on level surfaces with SPC demonstrating adequate R foot clearance to reduce risk for falls by 03/23/16   Status Achieved     PT SHORT TERM GOAL #4   Title Pt will demonstrate reduced risk for falls as evidenced by Merrilee Jansky Balance Scale score >/= 45/56 by 03/23/16   Status Achieved           PT Long Term Goals - 03/26/16 1344      PT LONG TERM GOAL #1   Title Independent with advanced HEP as indicated by 04/19/16   Status On-going     PT LONG TERM GOAL #2   Title R LE strength >/= 4/5 for improved balance and gait stability by 04/19/16   Status Partially Met  Met except ankle inversion/eversion 4-/5     PT LONG TERM GOAL #3   Title Pt will safely ambulate on level surfaces without AD demonstrating normal gait pattern to reduce risk for falls by 04/19/16   Status On-going     PT LONG  TERM GOAL #4   Title Pt will demonstrate low risk for falls as evidenced by Merrilee Jansky Balance Scale score >/= 52/56 by 04/19/16   Status Achieved  Berg = 53/56               Plan - 04/02/16 1148    Clinical Impression Statement Focused on dynamic gait activities w/o AD to improve coordination and balance reactions in prep for weaning from AD. Pt able to perform all activities, but demonstrates limited coordination with tandem gait, high stepping and braiding/grapevine creating LOB requiring pt to steady himself using wall.   PT Treatment/Interventions Patient/family education;Neuromuscular re-education;Balance training;Therapeutic exercise;Therapeutic activities;Functional mobility training;Gait training;Stair training;Manual techniques;Taping;Electrical Stimulation;Moist Heat;ADLs/Self Care Home Management   PT Next Visit Plan R LE strengthing with continued progression of standing, proprioception and CKC exercises as tolerated; Standing balance/coordination activities; Gait training working  toward weaning AD   Consulted and Agree with Plan of Care Patient      Patient will benefit from skilled therapeutic intervention in order to improve the following deficits and impairments:  Decreased strength, Decreased coordination, Decreased balance, Impaired tone, Difficulty walking, Abnormal gait, Pain  Visit Diagnosis: Hemiplegia and hemiparesis following cerebral infarction affecting right dominant side (HCC)  Other lack of coordination  Muscle weakness (generalized)  Right-sided low back pain without sciatica     Problem List Patient Active Problem List   Diagnosis Date Noted  . Right sided weakness 03/26/2016  . Cerebrovascular accident (CVA) due to embolism ([place]) 03/26/2016  . Obesity 03/26/2016  . Visit for preventive health examination 01/20/2016  . Daytime somnolence 01/20/2016  . Prostate cancer screening 01/20/2016  . Depression 04/04/2015  . Essential hypertension, benign 04/04/2015  . Chronic fatigue 04/04/2015  . History of stroke 04/04/2015  . Low serum vitamin D 04/04/2015  . Low testosterone 04/04/2015    Percival Spanish, PT, MPT 04/02/2016, 11:59 AM  [facility] [address]  [place], [place], [address] Phone: [phone]   Fax:  [phone]  Name: Antonio Ellison MRN: [id number] Date of Birth: Jan 17, 1966

## 2016-04-03 ENCOUNTER — Telehealth: Payer: Self-pay | Admitting: Neurology

## 2016-04-03 NOTE — Telephone Encounter (Signed)
Our office received notification this week that the pre-certification for MRI was completed for [facility], but [facility].  Our office already handled this issue by contacting the insurance company and switching the location from [facility] to [facility].  Advised pt that we have already handled this and he is approved to have the MRI at [facility].

## 2016-04-03 NOTE — Telephone Encounter (Signed)
Antonio Ellison 2065-12-14. He was scheduled to have an MRI on Saturday 05/07/16 but his Insurance called and said that it wouldn't be covered? He has cancelled that appointment and they said he should go [facility] [employer]. He was not sure if he should call to set it up or if our office does that. His # is [phone]. Thank you

## 2016-04-04 ENCOUNTER — Ambulatory Visit: Payer: PRIVATE HEALTH INSURANCE | Admitting: Physical Therapy

## 2016-04-04 ENCOUNTER — Ambulatory Visit: Payer: PRIVATE HEALTH INSURANCE | Admitting: Occupational Therapy

## 2016-04-04 DIAGNOSIS — I69351 Hemiplegia and hemiparesis following cerebral infarction affecting right dominant side: Secondary | ICD-10-CM

## 2016-04-04 DIAGNOSIS — M545 Low back pain, unspecified: Secondary | ICD-10-CM

## 2016-04-04 DIAGNOSIS — R278 Other lack of coordination: Secondary | ICD-10-CM

## 2016-04-04 DIAGNOSIS — M6281 Muscle weakness (generalized): Secondary | ICD-10-CM

## 2016-04-04 NOTE — Therapy (Signed)
[facility] [address]  [address] [place], [place], [address] Phone: [phone]   Fax:  [phone]  Occupational Therapy Treatment  Patient Details  Name: Antonio Ellison MRN: [id number] Date of Birth: 04/04/1966 Referring Provider: Esperanza Richters PA-C  Encounter Date: 04/04/2016      OT End of Session - 04/04/16 1254    Visit Number 10   Number of Visits 17   Date for OT Re-Evaluation 04/16/16   Authorization Type [employer]   Authorization Time Period 60 visit limit (PT referral requested) - WEEK 4/8   Authorization - Visit Number 10   Authorization - Number of Visits 30   OT Start Time 1145   OT Stop Time 1230   OT Time Calculation (min) 45 min   Activity Tolerance Patient tolerated treatment well      Past Medical History:  Diagnosis Date  . Asthma   . History of chicken pox    34 Mths old  . Hyperlipidemia   . Hypertension   . Low testosterone   . Stroke [facility]) 2012    Past Surgical History:  Procedure Laterality Date  . ACHILLES TENDON REPAIR     Left Leg    There were no vitals filed for this visit.      Subjective Assessment - 04/04/16 1249    Subjective  My arm is tired (after below activity)    Pertinent History h/o CVA   Patient Stated Goals To get my arms working like they used to   Currently in Pain? No/denies                      OT Treatments/Exercises (OP) - 04/04/16 0001      Neurological Re-education Exercises   Other Exercises 1 Prone: scapula retraction with horizontal abd. for scapula and sh. girdle stabalization bilaterally. Progressed to prone with arms off edge of mat for scapula retraction with full shoulder flexion bilaterally.    Other Weight-Bearing Exercises 1 Plank position x 5 reps holding 5 sec. Progressed to transitional wt bearing activity moving from quadraped to modified plank position and back x 5 reps.    Other Weight-Bearing Exercises 2 Seated:  bridging pelvis off mat wt bearing through BUE's in shoulder extension x 5 reps, holding 5 sec.      Functional Reaching Activities   High Level High level reaching to place pegs in pegboard RUE with 1 lb. wrist weight for UE strength/endurance and coordination. Pt copying complex design with min errors and min to mod cues to correct errors. Pt fatigued RUE by end of task                  OT Short Term Goals - 03/12/16 1347      OT SHORT TERM GOAL #1   Title Independent with initial HEP (All STG's due 03/17/16)    Time 4   Period Weeks   Status Achieved     OT SHORT TERM GOAL #2   Title Pt to perform bathing with only min assist or less   Baseline max assist   Time 4   Period Weeks   Status Achieved     OT SHORT TERM GOAL #3   Title Pt to consistently hook buttons, fastners, cut food, and tie shoes at Mod I level with A/E prn   Time 4   Period Weeks   Status Achieved  except cutting food     OT  SHORT TERM GOAL #4   Title Pt to verbalize understanding with pain management strategies for neck and RUE   Time 4   Period Weeks   Status Deferred  no longer needed - pt has had no pain      OT SHORT TERM GOAL #5   Title Pt to improve RUE function as evidenced by performing 25 or greater on Box & Blocks test   Baseline eval = 18 (Lt = 45)    Time 4   Period Weeks   Status Achieved  03/12/16: 36 BLOCKS           OT Long Term Goals - 03/12/16 1348      OT LONG TERM GOAL #1   Title Independent with updated HEP (All LTG's due 04/16/16)    Time 8   Period Weeks   Status New     OT LONG TERM GOAL #2   Title Improve coordination RUE as evidenced by performing 9 hole peg test in 50 sec. or less   Baseline eval = 86.04 sec (Lt = 25.50 sec)    Time 8   Period Weeks   Status New     OT LONG TERM GOAL #3   Title Improve grip strength Rt hand to 50 lbs or greater to assist with opening jars   Baseline eval = 38 lbs (Lt = 113 lbs)    Time 8   Period Weeks   Status  Achieved  03/12/16: 68 LBS     OT LONG TERM GOAL #4   Title Pt to retrieve and replace lightweight object from cabinet RUE requiring 100* shoulder flexion or >   Baseline eval = approx 40* sh. flex   Time 8   Period Weeks   Status New     OT LONG TERM GOAL #5   Title Pt to perform simple meal prep/light cleaning activities Mod I level with DME prn   Time 8   Period Weeks   Status New               Plan - 04/04/16 1256    Clinical Impression Statement Pt progressing with RUE coordination, ROM and endurance. Pt fatigues with repetitive overhead reaching   OT Frequency 2x / week   OT Duration 8 weeks   OT Treatment/Interventions Self-care/ADL training;Therapeutic exercise;Functional Mobility Training;Patient/family education;Neuromuscular education;Splinting;Manual Therapy;DME and/or AE instruction;Therapeutic activities;Electrical Stimulation;Parrafin;Cognitive remediation/compensation;Moist Heat;Passive range of motion;Visual/perceptual remediation/compensation   Plan continue NMR, coordination, writing   Consulted and Agree with Plan of Care Patient      Patient will benefit from skilled therapeutic intervention in order to improve the following deficits and impairments:  Decreased coordination, Decreased range of motion, Decreased endurance, Increased edema, Impaired sensation, Decreased knowledge of precautions, Decreased knowledge of use of DME, Impaired UE functional use, Pain, Decreased mobility, Decreased strength, Impaired vision/preception, Decreased cognition  Visit Diagnosis: Hemiplegia and hemiparesis following cerebral infarction affecting right dominant side (HCC)  Other lack of coordination  Muscle weakness (generalized)    Problem List Patient Active Problem List   Diagnosis Date Noted  . Right sided weakness 03/26/2016  . 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 Obesity 03/26/2016  . Visit for preventive health examination  01/20/2016  . Daytime somnolence 01/20/2016  . Prostate cancer screening 01/20/2016  . Depression 04/04/2015  . Essential hypertension, benign 04/04/2015  . Chronic fatigue 04/04/2015  . History of stroke 04/04/2015  . Low serum vitamin D 04/04/2015  . Low testosterone  04/04/2015    Kelli ChurnBallie, Icyss Skog Johnson, OTR/L 04/04/2016, 12:58 PM  [facility] [address]  [address] [place], [place], [address] Phone: [phone]   Fax:  [phone]  Name: Murvin Natalric Disbro MRN: [id number] Date of Birth: 1965-09-22

## 2016-04-04 NOTE — Therapy (Signed)
[facility] [address]  [place], [place], [address] Phone: [phone]   Fax:  [phone]  Physical Therapy Treatment  Patient Details  Name: Antonio Ellison MRN: [id number] Date of Birth: 10/24/1965 Referring Provider: Mackie Pai, PA-C  Encounter Date: 04/04/2016      PT End of Session - 04/04/16 1108    Visit Number 14   Number of Visits 16   Date for PT Re-Evaluation 04/19/16   PT Start Time 1108   PT Stop Time 1149   PT Time Calculation (min) 41 min   Activity Tolerance Patient tolerated treatment well   Behavior During Therapy [facility] for tasks assessed/performed      Past Medical History:  Diagnosis Date  . Asthma   . History of chicken pox    56 Mths old  . Hyperlipidemia   . Hypertension   . Low testosterone   . Stroke [facility]) 2012    Past Surgical History:  Procedure Laterality Date  . ACHILLES TENDON REPAIR     Left Leg    There were no vitals filed for this visit.      Subjective Assessment - 04/04/16 1108    Subjective Pt w/o c/o today other than the usual "tired".   Patient Stated Goals "walk w/o a walker"   Currently in Pain? No/denies          Today's Treatment  TherEx NuStep - lvl 6 x 5' BATCA Leg Press 45# x10 BATCA Knee Flexion 25# x10 B con/R ecc BATCA Knee Extension 25# x10 B con/R ecc TRX Squat x10 R/L SLS + Opposite LE 4 way SLR with green TB x10 each          PT Short Term Goals - 03/26/16 1341      PT SHORT TERM GOAL #1   Title Independent with initial LE strengthening HEP by 03/23/16   Status Achieved     PT SHORT TERM GOAL #2   Title R LE strength grossly 3+/5 to 4-/5 for improved balance and gait stability by 03/23/16   Status Achieved     PT SHORT TERM GOAL #3   Title Pt will safely ambulate on level surfaces with SPC demonstrating adequate R foot clearance to reduce risk for falls by 03/23/16   Status Achieved     PT SHORT TERM GOAL #4   Title  Pt will demonstrate reduced risk for falls as evidenced by Merrilee Jansky Balance Scale score >/= 45/56 by 03/23/16   Status Achieved           PT Long Term Goals - 04/04/16 1148      PT LONG TERM GOAL #1   Title Independent with advanced HEP as indicated by 04/19/16   Status On-going     PT LONG TERM GOAL #2   Title R LE strength >/= 4/5 for improved balance and gait stability by 04/19/16   Status Partially Met  Met except ankle inversion/eversion 4-/5     PT LONG TERM GOAL #3   Title Pt will safely ambulate on level surfaces without AD demonstrating normal gait pattern to reduce risk for falls by 04/19/16   Status On-going     PT LONG TERM GOAL #4   Title Pt will demonstrate low risk for falls as evidenced by Merrilee Jansky Balance Scale score >/= 52/56 by 04/19/16   Status Achieved  Berg = 53/56  Plan - 04/04/16 1148    Clinical Impression Statement Targeted LE strengthening, coordination and proprioception activities with improving balance and stability noted but continues to fatigue quickly.    PT Treatment/Interventions Patient/family education;Neuromuscular re-education;Balance training;Therapeutic exercise;Therapeutic activities;Functional mobility training;Gait training;Stair training;Manual techniques;Taping;Electrical Stimulation;Moist Heat;ADLs/Self Care Home Management   PT Next Visit Plan R LE strengthing with continued progression of standing, proprioception and CKC exercises as tolerated; Standing balance/coordination activities; Gait training working toward weaning AD   Consulted and Agree with Plan of Care Patient      Patient will benefit from skilled therapeutic intervention in order to improve the following deficits and impairments:  Decreased strength, Decreased coordination, Decreased balance, Impaired tone, Difficulty walking, Abnormal gait, Pain  Visit Diagnosis: Hemiplegia and hemiparesis following cerebral infarction affecting right dominant side  (HCC)  Other lack of coordination  Muscle weakness (generalized)  Right-sided low back pain without sciatica     Problem List Patient Active Problem List   Diagnosis Date Noted  . Right sided weakness 03/26/2016  . Cerebrovascular accident (CVA) due to embolism (Time) 03/26/2016  . Obesity 03/26/2016  . Visit for preventive health examination 01/20/2016  . Daytime somnolence 01/20/2016  . Prostate cancer screening 01/20/2016  . Depression 04/04/2015  . Essential hypertension, benign 04/04/2015  . Chronic fatigue 04/04/2015  . History of stroke 04/04/2015  . Low serum vitamin D 04/04/2015  . Low testosterone 04/04/2015    Percival Spanish, PT, MPT 04/04/2016, 12:45 PM  [facility] [address]  [place], [place], [address] Phone: [phone]   Fax:  [phone]  Name: Traevon Meiring MRN: [id number] Date of Birth: 1966-02-12

## 2016-04-06 ENCOUNTER — Encounter: Payer: Self-pay | Admitting: Pulmonary Disease

## 2016-04-06 ENCOUNTER — Ambulatory Visit (INDEPENDENT_AMBULATORY_CARE_PROVIDER_SITE_OTHER): Payer: PRIVATE HEALTH INSURANCE | Admitting: Pulmonary Disease

## 2016-04-06 VITALS — BP 116/80 | HR 60 | Ht 72.0 in | Wt 233.6 lb

## 2016-04-06 DIAGNOSIS — I6320 Cerebral infarction due to unspecified occlusion or stenosis of unspecified precerebral arteries: Secondary | ICD-10-CM

## 2016-04-06 DIAGNOSIS — R0683 Snoring: Secondary | ICD-10-CM | POA: Diagnosis not present

## 2016-04-06 NOTE — Progress Notes (Signed)
Past Surgical History He  has a past surgical history that includes Achilles tendon repair.  No Known Allergies  Family History His family history includes Alzheimer's disease in his maternal grandmother; Aneurysm in his paternal grandmother; Diabetes in his father; Healthy in his daughter, sister, and son; Heart disease in his maternal grandmother; Hypertension in his father, maternal aunt, maternal grandfather, paternal grandfather, and paternal grandmother; Kidney disease in his maternal aunt; Liver cancer in his maternal aunt; Sinusitis in his mother; Stroke in his father and paternal grandfather.  Social History He  reports that he has never smoked. He has never used smokeless tobacco. He reports that he does not drink alcohol or use drugs.  Review of systems  Constitutional: Negative for fever and unexpected weight change.  HENT: Positive for sneezing. Negative for congestion, dental problem, ear pain, nosebleeds, postnasal drip, rhinorrhea, sinus pressure, sore throat and trouble swallowing.   Eyes: Negative for redness and itching.  Respiratory: Positive for shortness of breath. Negative for cough, chest tightness and wheezing.   Cardiovascular: Negative for palpitations and leg swelling.  Gastrointestinal: Negative for nausea and vomiting.  Genitourinary: Negative for dysuria.  Musculoskeletal: Positive for arthralgias and joint swelling.  Skin: Negative for rash.  Neurological: Positive for headaches.  Hematological: Does not bruise/bleed easily.  Psychiatric/Behavioral: Positive for dysphoric mood. The patient is nervous/anxious.     Current Outpatient Prescriptions on File Prior to Visit  Medication Sig  . amLODipine (NORVASC) 10 MG tablet Take 1 tablet (10 mg total) by mouth daily.  Marland Kitchen atorvastatin (LIPITOR) 80 MG tablet Take 1 tablet (80 mg total) by mouth daily.  Marland Kitchen buPROPion (WELLBUTRIN SR) 150 MG 12 hr tablet Take 1 tablet (150 mg total) by mouth 2 (two) times daily.  .  clopidogrel (PLAVIX) 75 MG tablet Take 75 mg by mouth daily.  . cyclobenzaprine (FLEXERIL) 5 MG tablet Take 5 mg by mouth 3 (three) times daily as needed for muscle spasms.  . fexofenadine (ALLEGRA) 180 MG tablet Take 180 mg by mouth daily as needed for allergies or rhinitis. Reported on 11/16/2015  . lisinopril (PRINIVIL,ZESTRIL) 10 MG tablet Take 1 tablet (10 mg total) by mouth daily.  . sertraline (ZOLOFT) 50 MG tablet Take 1 tablet (50 mg total) by mouth daily.   No current facility-administered medications on file prior to visit.     Chief Complaint  Patient presents with  . Sleep Consult    Referred by Dr Daphine Deutscher. Sleep Study x 5 years ago at [facility]. Epworth Score: 9    Cardiac tests: Echo 01/30/16 >> EF 65 to 70%, mild LVH  Past medical history He  has a past medical history of Asthma; Chronic headaches; History of chicken pox; Hyperlipidemia; Hypertension; Low testosterone; and Stroke (HCC) (2012).  Vital signs BP 116/80 (BP Location: Left Arm, Cuff Size: Normal)   Pulse 60   Ht 6' (1.829 m)   Wt 233 lb 9.6 oz (106 kg)   SpO2 98%   BMI 31.68 kg/m   History of Present Illness Antonio Ellison is a 50 y.o. male for evaluation of sleep problems.  He was in hospital in June for recurrent CVA.  He had Rt sided weakness.  His strength has improved.  His neurologist was concerned about his snoring and daytime sleepiness.  He was advised to have sleep evaluation to exclude presence of sleep apnea as contributor to risk of recurrent CVA.Marland Kitchen  His wife says he snores.  His mouth gets dry at night.  He  has a hard time sleeping on his back.  He doesn't dream much.  He goes to sleep between 9 and 10 pm.  He falls asleep immediately.  He wakes up rarely to use the bathroom.  He gets out of bed at around 9 am on most days, but sometimes later.  He feels tired in the morning.  He denies morning headache.  He does not use anything to help him fall sleep or stay awake.  He can fall asleep at  anytime of the day.  He will sleep for 30 minutes to an hour.  He denies sleep walking, sleep talking, bruxism, or nightmares.  There is no history of restless legs.  He denies sleep hallucinations, sleep paralysis, or cataplexy.  The Epworth score is 9 out of 24.   Physical Exam:  General - No distress ENT - No sinus tenderness, no oral exudate, no LAN, no thyromegaly, TM clear, pupils equal/reactive, MP 4, enlarged tongue Cardiac - s1s2 regular, no murmur, pulses symmetric Chest - No wheeze/rales/dullness, good air entry, normal respiratory excursion Back - No focal tenderness Abd - Soft, non-tender, no organomegaly, + bowel sounds Ext - No edema Neuro - Normal strength, cranial nerves intact Skin - No rashes Psych - Normal mood, and behavior  Discussion: He has snoring, and daytime sleepiness.  He has hx of recurrent CVA and HTN.  I am concerned he could have sleep disordered breathing.  Given recent CVA he could have obstructive sleep apnea, central sleep apnea, and/or Cheyne Stoke respiratory pattern.  We discussed how sleep apnea can affect various health problems, including risks for hypertension, cardiovascular disease, and diabetes.  We also discussed how sleep disruption can increase risks for accidents, such as while driving.  Weight loss as a means of improving sleep apnea was also reviewed.  Additional treatment options discussed were CPAP therapy, oral appliance, and surgical intervention.   Assessment/plan:  Snoring with concern for sleep disordered breathing in setting of recurrent CVA. - will arrange for in lab sleep study to further assess   Patient Instructions  Will arrange for in lab sleep study Will call to arrange for follow up after sleep study reviewed     Coralyn HellingVineet Tauno Falotico, M.D. Pager [phone] 04/06/2016, 9:27 AM

## 2016-04-06 NOTE — Progress Notes (Signed)
   Subjective:    Patient ID: Antonio Ellison, male    DOB: November 07, 1965, 50 y.o.   MRN: [id number]  HPI    Review of Systems  Constitutional: Negative for fever and unexpected weight change.  HENT: Positive for sneezing. Negative for congestion, dental problem, ear pain, nosebleeds, postnasal drip, rhinorrhea, sinus pressure, sore throat and trouble swallowing.   Eyes: Negative for redness and itching.  Respiratory: Positive for shortness of breath. Negative for cough, chest tightness and wheezing.   Cardiovascular: Negative for palpitations and leg swelling.  Gastrointestinal: Negative for nausea and vomiting.  Genitourinary: Negative for dysuria.  Musculoskeletal: Positive for arthralgias and joint swelling.  Skin: Negative for rash.  Neurological: Positive for headaches.  Hematological: Does not bruise/bleed easily.  Psychiatric/Behavioral: Positive for dysphoric mood. The patient is nervous/anxious.        Objective:   Physical Exam        Assessment & Plan:

## 2016-04-06 NOTE — Patient Instructions (Signed)
Will arrange for in lab sleep study Will call to arrange for follow up after sleep study reviewed  

## 2016-04-07 ENCOUNTER — Ambulatory Visit (HOSPITAL_BASED_OUTPATIENT_CLINIC_OR_DEPARTMENT_OTHER)
Admission: RE | Admit: 2016-04-07 | Discharge: 2016-04-07 | Disposition: A | Discharge status: Home / Self Care | Payer: PRIVATE HEALTH INSURANCE | Source: Ambulatory Visit | Attending: Neurology | Admitting: Neurology

## 2016-04-07 DIAGNOSIS — I6319 Cerebral infarction due to embolism of other precerebral artery: Secondary | ICD-10-CM | POA: Diagnosis not present

## 2016-04-07 DIAGNOSIS — I6782 Cerebral ischemia: Secondary | ICD-10-CM | POA: Insufficient documentation

## 2016-04-07 MED ORDER — GADOBENATE DIMEGLUMINE 529 MG/ML IV SOLN: 20.0000 mL | Freq: Once | INTRAVENOUS | Status: DC | PRN

## 2016-04-09 ENCOUNTER — Ambulatory Visit: Payer: PRIVATE HEALTH INSURANCE | Admitting: Physical Therapy

## 2016-04-09 ENCOUNTER — Ambulatory Visit: Payer: PRIVATE HEALTH INSURANCE | Admitting: Occupational Therapy

## 2016-04-09 DIAGNOSIS — M6281 Muscle weakness (generalized): Secondary | ICD-10-CM

## 2016-04-09 DIAGNOSIS — M545 Low back pain, unspecified: Secondary | ICD-10-CM

## 2016-04-09 DIAGNOSIS — R278 Other lack of coordination: Secondary | ICD-10-CM

## 2016-04-09 DIAGNOSIS — I69351 Hemiplegia and hemiparesis following cerebral infarction affecting right dominant side: Secondary | ICD-10-CM | POA: Diagnosis not present

## 2016-04-09 NOTE — Therapy (Signed)
[facility] [address]  [address] [place], [place], [address] Phone: [phone]   Fax:  [phone]  Occupational Therapy Treatment  Patient Details  Name: Antonio Ellison MRN: [id number] Date of Birth: [birth year] Referring Provider: Esperanza Richters PA-C  Encounter Date: 04/09/2016      OT End of Session - 04/09/16 1242    Visit Number 11   Number of Visits 17   Date for OT Re-Evaluation 04/16/16   Authorization Type [employer]   Authorization Time Period 60 visit limit (PT referral requested) - WEEK 6/8   Authorization - Visit Number 11   Authorization - Number of Visits 30   OT Start Time 1145   OT Stop Time 1230   OT Time Calculation (min) 45 min   Activity Tolerance Patient tolerated treatment well      Past Medical History:  Diagnosis Date  . Asthma   . Chronic headaches   . History of chicken pox    69 Mths old  . Hyperlipidemia   . Hypertension   . Low testosterone   . Stroke [facility]) 2012    Past Surgical History:  Procedure Laterality Date  . ACHILLES TENDON REPAIR     Left Leg    There were no vitals filed for this visit.      Subjective Assessment - 04/09/16 1152    Pertinent History h/o CVA   Patient Stated Goals To get my arms working like they used to   Currently in Pain? No/denies                      OT Treatments/Exercises (OP) - 04/09/16 0001      ADLs   Writing Pt practiced writing short paragraph in print with extra time and 75% legibility. Pt required cues to maintain size     Fine Motor Coordination   Other Fine Motor Exercises Pt placing pin sized pegs in pegboard w/o tweezers using Rt hand with mod to max drops for 3 rows. Pt with max difficulty at beginning of task but improved with repetition. Pt then did 1 row with tweezers with mod difficulty and drops   Other Fine Motor Exercises Gross motor coordination: tossing ball in air and catching with same hand (with Rt  hand only, alternating, Rt/Lt hand, then simultaneously). Progressed to juggling 2 balls with max difficulty initially d/t motor planning deficits. Dribbling large ball Rt hand for timing/coordination with improvement from when last attempted.      Neurological Re-education Exercises   Other Exercises 1 Pt prone with UE's over EOB - performing bilateral high level shoulder flexion with 2 lb. weight (palms facing each other)   Other Weight-Bearing Exercises 1 Plank position x 5 reps holding 5 sec. Progressed to transitional wt bearing activity moving from quadraped to modified plank position and back x 5 reps.    Other Weight-Bearing Exercises 2 Seated: bridging pelvis off mat wt bearing through BUE's in shoulder extension x 5 reps, holding 5 sec.                   OT Short Term Goals - 03/12/16 1347      OT SHORT TERM GOAL #1   Title Independent with initial HEP (All STG's due 03/17/16)    Time 4   Period Weeks   Status Achieved     OT SHORT TERM GOAL #2   Title Pt to perform bathing with only min assist or  less   Baseline max assist   Time 4   Period Weeks   Status Achieved     OT SHORT TERM GOAL #3   Title Pt to consistently hook buttons, fastners, cut food, and tie shoes at Mod I level with A/E prn   Time 4   Period Weeks   Status Achieved  except cutting food     OT SHORT TERM GOAL #4   Title Pt to verbalize understanding with pain management strategies for neck and RUE   Time 4   Period Weeks   Status Deferred  no longer needed - pt has had no pain      OT SHORT TERM GOAL #5   Title Pt to improve RUE function as evidenced by performing 25 or greater on Box & Blocks test   Baseline eval = 18 (Lt = 45)    Time 4   Period Weeks   Status Achieved  03/12/16: 36 BLOCKS           OT Long Term Goals - 03/12/16 1348      OT LONG TERM GOAL #1   Title Independent with updated HEP (All LTG's due 04/16/16)    Time 8   Period Weeks   Status New     OT LONG TERM  GOAL #2   Title Improve coordination RUE as evidenced by performing 9 hole peg test in 50 sec. or less   Baseline eval = 86.04 sec (Lt = 25.50 sec)    Time 8   Period Weeks   Status New     OT LONG TERM GOAL #3   Title Improve grip strength Rt hand to 50 lbs or greater to assist with opening jars   Baseline eval = 38 lbs (Lt = 113 lbs)    Time 8   Period Weeks   Status Achieved  03/12/16: 68 LBS     OT LONG TERM GOAL #4   Title Pt to retrieve and replace lightweight object from cabinet RUE requiring 100* shoulder flexion or >   Baseline eval = approx 40* sh. flex   Time 8   Period Weeks   Status New     OT LONG TERM GOAL #5   Title Pt to perform simple meal prep/light cleaning activities Mod I level with DME prn   Time 8   Period Weeks   Status New               Plan - 04/09/16 1243    Clinical Impression Statement Pt progressing with mobility, RUE coordination, strength/endurance.    OT Frequency 2x / week   OT Duration 8 weeks   OT Treatment/Interventions Self-care/ADL training;Therapeutic exercise;Functional Mobility Training;Patient/family education;Neuromuscular education;Splinting;Manual Therapy;DME and/or AE instruction;Therapeutic activities;Electrical Stimulation;Parrafin;Cognitive remediation/compensation;Moist Heat;Passive range of motion;Visual/perceptual remediation/compensation   Plan continue [facility] and gross motor coordination, gripper activity, functional balance   Consulted and Agree with Plan of Care Patient;Family member/caregiver   Family Member Consulted Mom       Patient will benefit from skilled therapeutic intervention in order to improve the following deficits and impairments:  Decreased coordination, Decreased range of motion, Decreased endurance, Increased edema, Impaired sensation, Decreased knowledge of precautions, Decreased knowledge of use of DME, Impaired UE functional use, Pain, Decreased mobility, Decreased strength, Impaired  vision/preception, Decreased cognition  Visit Diagnosis: Hemiplegia and hemiparesis following cerebral infarction affecting right dominant side (HCC)  Other lack of coordination  Muscle weakness (generalized)    Problem List Patient Active Problem  List   Diagnosis Date Noted  . Right sided weakness 03/26/2016  . 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 Obesity 03/26/2016  . Visit for preventive health examination 01/20/2016  . Daytime somnolence 01/20/2016  . Prostate cancer screening 01/20/2016  . Depression 04/04/2015  . Essential hypertension, benign 04/04/2015  . Chronic fatigue 04/04/2015  . History of stroke 04/04/2015  . Low serum vitamin D 04/04/2015  . Low testosterone 04/04/2015    Kelli ChurnBallie, Hadrian Yarbrough Johnson, OTR/L 04/09/2016, 12:45 PM  [facility] [address]  [address] [place], [place], [address] Phone: [phone]   Fax:  [phone]  Name: Murvin Natalric Nong MRN: [id number] Date of Birth: 07/06/1966

## 2016-04-09 NOTE — Therapy (Signed)
[facility] [address]  [place], [place], [address] Phone: [phone]   Fax:  [phone]  Physical Therapy Treatment  Patient Details  Name: Antonio Ellison MRN: [id number] Date of Birth: 08/01/1966 Referring Provider: Mackie Pai, PA-C  Encounter Date: 04/09/2016      PT End of Session - 04/09/16 1112    Visit Number 14  [place] last visit - should have been #13   Number of Visits 16   Date for PT Re-Evaluation 04/19/16   PT Start Time 1112  Pt arrived late   PT Stop Time 1150   PT Time Calculation (min) 38 min   Activity Tolerance Patient tolerated treatment well   Behavior During Therapy [facility] for tasks assessed/performed      Past Medical History:  Diagnosis Date  . Asthma   . Chronic headaches   . History of chicken pox    76 Mths old  . Hyperlipidemia   . Hypertension   . Low testosterone   . Stroke [facility]) 2012    Past Surgical History:  Procedure Laterality Date  . ACHILLES TENDON REPAIR     Left Leg    There were no vitals filed for this visit.      Subjective Assessment - 04/09/16 1112    Subjective Pt reports using cane less at home, but still feels like he needs it when he goes out due to fatigue.   Patient Stated Goals "walk w/o a walker"         Today's Treatment  TherEx Rec Bike - lvl 4 x 5'  Neuro Corner standing on blue foam Airex pad:    Narrow BOS Rhomberg stance       Eyes closed x30"       Visual scanning - Lateral & Vertical x10 each    Semi-tandem BOS Rhomberg stance       Eyes closed x30"       Visual scanning - Lateral & Vertical x10 each    Tandem BOS Rhomberg stance       Eyes closed x10" Fwd Step-up to 9" step + blue foam oval with contralateral Hip flexion/Knee extension with green TB x10 each Foam balance beam:    B Sidestepping x 4 passes    B Sidestepping with green TB around ankles x 4 passes    Tandem gait Fwd x 4 passes (unable to complete  tandem gait backwards on beam) Fwd/Back Tandem gait on firm surface x10 ft                 PT Short Term Goals - 03/26/16 1341      PT SHORT TERM GOAL #1   Title Independent with initial LE strengthening HEP by 03/23/16   Status Achieved     PT SHORT TERM GOAL #2   Title R LE strength grossly 3+/5 to 4-/5 for improved balance and gait stability by 03/23/16   Status Achieved     PT SHORT TERM GOAL #3   Title Pt will safely ambulate on level surfaces with SPC demonstrating adequate R foot clearance to reduce risk for falls by 03/23/16   Status Achieved     PT SHORT TERM GOAL #4   Title Pt will demonstrate reduced risk for falls as evidenced by Merrilee Jansky Balance Scale score >/= 45/56 by 03/23/16   Status Achieved           PT Long Term Goals - 04/04/16 1148  PT LONG TERM GOAL #1   Title Independent with advanced HEP as indicated by 04/19/16   Status On-going     PT LONG TERM GOAL #2   Title R LE strength >/= 4/5 for improved balance and gait stability by 04/19/16   Status Partially Met  Met except ankle inversion/eversion 4-/5     PT LONG TERM GOAL #3   Title Pt will safely ambulate on level surfaces without AD demonstrating normal gait pattern to reduce risk for falls by 04/19/16   Status On-going     PT LONG TERM GOAL #4   Title Pt will demonstrate low risk for falls as evidenced by Merrilee Jansky Balance Scale score >/= 52/56 by 04/19/16   Status Achieved  Berg = 53/56               Plan - 04/09/16 1150    Clinical Impression Statement Pt demonstrating improving gait stability with decreased reliance on SPC, altough continues to prefer use of cane due to fatigue. Continues to tolerate progressive balance/coordination challenges with some difficulty with higher level activities, but overall improvement noted. Pt nearing end of current POC (#14 of 16), therefore will assess progress toward goals to determine readiness for transition to HEP vs need for recert.   PT  Treatment/Interventions Patient/family education;Neuromuscular re-education;Balance training;Therapeutic exercise;Therapeutic activities;Functional mobility training;Gait training;Stair training;Manual techniques;Taping;Electrical Stimulation;Moist Heat;ADLs/Self Care Home Management   PT Next Visit Plan R LE strengthing with continued progression of standing, proprioception and CKC exercises as tolerated; Standing balance/coordination activities; Gait training working toward weaning AD   Consulted and Agree with Plan of Care Patient      Patient will benefit from skilled therapeutic intervention in order to improve the following deficits and impairments:  Decreased strength, Decreased coordination, Decreased balance, Impaired tone, Difficulty walking, Abnormal gait, Pain  Visit Diagnosis: Hemiplegia and hemiparesis following cerebral infarction affecting right dominant side (HCC)  Other lack of coordination  Muscle weakness (generalized)  Right-sided low back pain without sciatica     Problem List Patient Active Problem List   Diagnosis Date Noted  . Right sided weakness 03/26/2016  . Cerebrovascular accident (CVA) due to embolism ([place]) 03/26/2016  . Obesity 03/26/2016  . Visit for preventive health examination 01/20/2016  . Daytime somnolence 01/20/2016  . Prostate cancer screening 01/20/2016  . Depression 04/04/2015  . Essential hypertension, benign 04/04/2015  . Chronic fatigue 04/04/2015  . History of stroke 04/04/2015  . Low serum vitamin D 04/04/2015  . Low testosterone 04/04/2015    Percival Spanish, PT, MPT 04/09/2016, 12:09 PM  [facility] [address]  [place], [place], [address] Phone: [phone]   Fax:  [phone]  Name: Antonio Ellison MRN: [id number] Date of Birth: 08-19-1965

## 2016-04-11 ENCOUNTER — Ambulatory Visit: Payer: PRIVATE HEALTH INSURANCE | Admitting: Physical Therapy

## 2016-04-11 ENCOUNTER — Ambulatory Visit: Payer: PRIVATE HEALTH INSURANCE | Admitting: Occupational Therapy

## 2016-04-11 DIAGNOSIS — M545 Low back pain, unspecified: Secondary | ICD-10-CM

## 2016-04-11 DIAGNOSIS — R278 Other lack of coordination: Secondary | ICD-10-CM

## 2016-04-11 DIAGNOSIS — M6281 Muscle weakness (generalized): Secondary | ICD-10-CM

## 2016-04-11 DIAGNOSIS — I69351 Hemiplegia and hemiparesis following cerebral infarction affecting right dominant side: Secondary | ICD-10-CM

## 2016-04-11 NOTE — Therapy (Signed)
[facility] [address]  [address] [place], [place], [address] Phone: [phone]   Fax:  [phone]  Occupational Therapy Treatment  Patient Details  Name: Antonio Natalric Ellison MRN: [id number] Date of Birth: 1965/08/18 Referring Provider: Esperanza RichtersEdward Saguier PA-C  Encounter Date: 04/11/2016      OT End of Session - 04/11/16 1256    Visit Number 12   Number of Visits 17   Date for OT Re-Evaluation 04/16/16   Authorization Type [employer]   Authorization Time Period 60 visit limit (PT referral requested) - WEEK 6/8   Authorization - Visit Number 12   Authorization - Number of Visits 30   OT Start Time 1145   OT Stop Time 1230   OT Time Calculation (min) 45 min   Activity Tolerance Patient tolerated treatment well      Past Medical History:  Diagnosis Date  . Asthma   . Chronic headaches   . History of chicken pox    [age] Mths old  . Hyperlipidemia   . Hypertension   . Low testosterone   . Stroke [facility](H[facility]) 2012    Past Surgical History:  Procedure Laterality Date  . ACHILLES TENDON REPAIR     Left Leg    There were no vitals filed for this visit.      Subjective Assessment - 04/11/16 1145    Subjective  My Rt hand still has tingling but no pain   Pertinent History h/o CVA   Patient Stated Goals To get my arms working like they used to   Currently in Pain? No/denies                      OT Treatments/Exercises (OP) - 04/11/16 0001      ADLs   Functional Mobility Pt standing for functional reaching activities requiring wt shifts and reaching outside BOS with ipsilateral and contralateral reaching while stepping to side, stepping backwards, fowards from low and higher surfaces without LOB. Pt also walking and retrieving objects from floor without LOB     Shoulder Exercises: ROM/Strengthening   UBE (Upper Arm Bike) x 10 minutes Level 5 resistance      Hand Exercises   Other Hand Exercises Gripper set at 35  lbs. resistance to pick up blocks with Rt hand for sustained grip strength, control and coordination. Pt with mod difficulty and drops near end of task d/t fatigue. Pt now able to control wrist in neutral to slight extension as normal.      Fine Motor Coordination   Other Fine Motor Exercises Pt practiced braiding string and stringing straw pcs for bilateral integration and coordination. pt with mod difficulty braiding                  OT Short Term Goals - 03/12/16 1347      OT SHORT TERM GOAL #1   Title Independent with initial HEP (All STG's due 03/17/16)    Time 4   Period Weeks   Status Achieved     OT SHORT TERM GOAL #2   Title Pt to perform bathing with only min assist or less   Baseline max assist   Time 4   Period Weeks   Status Achieved     OT SHORT TERM GOAL #3   Title Pt to consistently hook buttons, fastners, cut food, and tie shoes at Mod I level with A/E prn   Time 4   Period Weeks  Status Achieved  except cutting food     OT SHORT TERM GOAL #4   Title Pt to verbalize understanding with pain management strategies for neck and RUE   Time 4   Period Weeks   Status Deferred  no longer needed - pt has had no pain      OT SHORT TERM GOAL #5   Title Pt to improve RUE function as evidenced by performing 25 or greater on Box & Blocks test   Baseline eval = 18 (Lt = 45)    Time 4   Period Weeks   Status Achieved  03/12/16: 36 BLOCKS           OT Long Term Goals - 03/12/16 1348      OT LONG TERM GOAL #1   Title Independent with updated HEP (All LTG's due 04/16/16)    Time 8   Period Weeks   Status New     OT LONG TERM GOAL #2   Title Improve coordination RUE as evidenced by performing 9 hole peg test in 50 sec. or less   Baseline eval = 86.04 sec (Lt = 25.50 sec)    Time 8   Period Weeks   Status New     OT LONG TERM GOAL #3   Title Improve grip strength Rt hand to 50 lbs or greater to assist with opening jars   Baseline eval = 38 lbs (Lt =  113 lbs)    Time 8   Period Weeks   Status Achieved  03/12/16: 68 LBS     OT LONG TERM GOAL #4   Title Pt to retrieve and replace lightweight object from cabinet RUE requiring 100* shoulder flexion or >   Baseline eval = approx 40* sh. flex   Time 8   Period Weeks   Status New     OT LONG TERM GOAL #5   Title Pt to perform simple meal prep/light cleaning activities Mod I level with DME prn   Time 8   Period Weeks   Status New               Plan - 04/11/16 1257    Clinical Impression Statement Pt continues to make progress with RUE coordination and functional balance   Rehab Potential Good   OT Frequency 2x / week   OT Duration 8 weeks   OT Treatment/Interventions Self-care/ADL training;Therapeutic exercise;Functional Mobility Training;Patient/family education;Neuromuscular education;Splinting;Manual Therapy;DME and/or AE instruction;Therapeutic activities;Electrical Stimulation;Parrafin;Cognitive remediation/compensation;Moist Heat;Passive range of motion;Visual/perceptual remediation/compensation   Plan continue coordination, writing on vertical surface, gross motor coordination with balance   Consulted and Agree with Plan of Care Patient      Patient will benefit from skilled therapeutic intervention in order to improve the following deficits and impairments:  Decreased coordination, Decreased range of motion, Decreased endurance, Increased edema, Impaired sensation, Decreased knowledge of precautions, Decreased knowledge of use of DME, Impaired UE functional use, Pain, Decreased mobility, Decreased strength, Impaired vision/preception, Decreased cognition  Visit Diagnosis: Hemiplegia and hemiparesis following cerebral infarction affecting right dominant side (HCC)  Other lack of coordination  Muscle weakness (generalized)    Problem List Patient Active Problem List   Diagnosis Date Noted  . Right sided weakness 03/26/2016  . Cerebrovascular accident (CVA) due  to embolism (HCC) 03/26/2016  . Obesity 03/26/2016  . Visit for preventive health examination 01/20/2016  . Daytime somnolence 01/20/2016  . Prostate cancer screening 01/20/2016  . Depression 04/04/2015  . Essential hypertension, benign 04/04/2015  .  Chronic fatigue 04/04/2015  . History of stroke 04/04/2015  . Low serum vitamin D 04/04/2015  . Low testosterone 04/04/2015    Kelli Churn, OTR/L 04/11/2016, 12:58 PM  [facility] [address]  [address] [place], [place], [address] Phone: [phone]   Fax:  [phone]  Name: Antonio Ellison MRN: [id number] Date of Birth: 03-May-1966

## 2016-04-11 NOTE — Therapy (Signed)
[facility] [address]  [place], [place], [address] Phone: [phone]   Fax:  [phone]  Physical Therapy Treatment  Patient Details  Name: Antonio Ellison MRN: [id number] Date of Birth: June 20, 1966 Referring Provider: Mackie Pai, PA-C  Encounter Date: 04/11/2016      PT End of Session - 04/11/16 1059    Visit Number 15   Number of Visits 16   Date for PT Re-Evaluation 04/19/16   PT Start Time 1059   PT Stop Time 1144   PT Time Calculation (min) 45 min   Activity Tolerance Patient tolerated treatment well   Behavior During Therapy [facility] for tasks assessed/performed      Past Medical History:  Diagnosis Date  . Asthma   . Chronic headaches   . History of chicken pox    20 Mths old  . Hyperlipidemia   . Hypertension   . Low testosterone   . Stroke [facility]) 2012    Past Surgical History:  Procedure Laterality Date  . ACHILLES TENDON REPAIR     Left Leg    There were no vitals filed for this visit.      Subjective Assessment - 04/11/16 1059    Subjective "I'm ready for you today"   Patient Stated Goals "walk w/o a walker"   Currently in Pain? No/denies            [facility] PT Assessment - 04/11/16 1100      Strength   Strength Assessment Site Hip;Knee;Ankle   Right Hip Flexion 4+/5   Right Hip Extension 4/5   Right Hip ABduction 4+/5   Right Hip ADduction 4/5   Left Hip Flexion 5/5   Left Hip Extension 5/5   Left Hip ABduction 5/5   Left Hip ADduction 5/5   Right Knee Flexion 4+/5   Right Knee Extension 5/5   Left Knee Flexion 5/5   Left Knee Extension 5/5   Right Ankle Dorsiflexion 4+/5   Right Ankle Plantar Flexion 4+/5   Right Ankle Inversion 4/5   Right Ankle Eversion 4+/5   Left Ankle Dorsiflexion 5/5   Left Ankle Plantar Flexion 5/5   Left Ankle Inversion 5/5   Left Ankle Eversion 5/5     Standardized Balance Assessment   Standardized Balance Assessment Berg Balance  Test;Timed Up and Go Test;Five Times Sit to Stand;10 meter walk test   Five times sit to stand comments  7.94"   10 Meter Walk 4.69 ft/sec  7.0" w/o AD     Berg Balance Test   Sit to Stand Able to stand without using hands and stabilize independently   Standing Unsupported Able to stand safely 2 minutes   Sitting with Back Unsupported but Feet Supported on Floor or Stool Able to sit safely and securely 2 minutes   Stand to Sit Sits safely with minimal use of hands   Transfers Able to transfer safely, minor use of hands   Standing Unsupported with Eyes Closed Able to stand 10 seconds safely   Standing Ubsupported with Feet Together Able to place feet together independently and stand 1 minute safely   From Standing, Reach Forward with Outstretched Arm Can reach confidently >25 cm (10")   From Standing Position, Pick up Object from Floor Able to pick up shoe safely and easily   From Standing Position, Turn to Look Behind Over each Shoulder Looks behind from both sides and weight shifts well   Turn 360 Degrees Able  to turn 360 degrees safely in 4 seconds or less   Standing Unsupported, Alternately Place Feet on Step/Stool Able to stand independently and safely and complete 8 steps in 20 seconds   Standing Unsupported, One Foot in [place] to place foot tandem independently and hold 30 seconds   Standing on One Leg Able to lift leg independently and hold 5-10 seconds  on R, >10 sec on L   Total Score 55     Timed Up and Go Test   TUG Normal TUG   Normal TUG (seconds) 6.07  w/o AD     Functional Gait  Assessment   Gait assessed  Yes   Gait Level Surface Walks 20 ft in less than 5.5 sec, no assistive devices, good speed, no evidence for imbalance, normal gait pattern, deviates no more than 6 in outside of the 12 in walkway width.   Change in Gait Speed Able to smoothly change walking speed without loss of balance or gait deviation. Deviate no more than 6 in outside of the 12 in walkway  width.   Gait with Horizontal Head Turns Performs head turns smoothly with no change in gait. Deviates no more than 6 in outside 12 in walkway width   Gait with Vertical Head Turns Performs head turns with no change in gait. Deviates no more than 6 in outside 12 in walkway width.   Gait and Pivot Turn Pivot turns safely within 3 sec and stops quickly with no loss of balance.   Step Over Obstacle Is able to step over one shoe box (4.5 in total height) without changing gait speed. No evidence of imbalance.   Gait with Narrow Base of Support Ambulates 4-7 steps.   Gait with Eyes Closed Walks 20 ft, uses assistive device, slower speed, mild gait deviations, deviates 6-10 in outside 12 in walkway width. Ambulates 20 ft in less than 9 sec but greater than 7 sec.   Ambulating Backwards Walks 20 ft, no assistive devices, good speed, no evidence for imbalance, normal gait   Steps Alternating feet, must use rail.   Total Score 25   FGA comment: low fall risk           Today's Treatment  TherEx Rec Bike - lvl 5 x 5'  PPT MMT 5x STS TUG 10 MWT/Gait speed Berg Balance Test FGA  Neuro Balance HEP provided based on corner balance and dynamic gait activities performed in previous sessions:   Corner standing balance      Rhomberg stance - narrow BOS & tandem stance      SLS activites   Tandem gait          PT Education - 04/11/16 1145    Education provided Yes   Education Details Balance HEP progression   Person(s) Educated Patient   Methods Explanation;Demonstration   Comprehension Verbalized understanding;Returned demonstration          PT Short Term Goals - 03/26/16 1341      PT SHORT TERM GOAL #1   Title Independent with initial LE strengthening HEP by 03/23/16   Status Achieved     PT SHORT TERM GOAL #2   Title R LE strength grossly 3+/5 to 4-/5 for improved balance and gait stability by 03/23/16   Status Achieved     PT SHORT TERM GOAL #3   Title Pt will safely  ambulate on level surfaces with SPC demonstrating adequate R foot clearance to reduce risk for falls by 03/23/16  Status Achieved     PT SHORT TERM GOAL #4   Title Pt will demonstrate reduced risk for falls as evidenced by Merrilee Jansky Balance Scale score >/= 45/56 by 03/23/16   Status Achieved           PT Long Term Goals - 04/11/16 1105      PT LONG TERM GOAL #1   Title Independent with advanced HEP as indicated by 04/19/16   Status On-going     PT LONG TERM GOAL #2   Title R LE strength >/= 4/5 for improved balance and gait stability by 04/19/16   Status Achieved  Met except ankle inversion/eversion 4-/5     PT LONG TERM GOAL #3   Title Pt will safely ambulate on level surfaces without AD demonstrating normal gait pattern to reduce risk for falls by 04/19/16   Status Achieved     PT LONG TERM GOAL #4   Title Pt will demonstrate low risk for falls as evidenced by Merrilee Jansky Balance Scale score >/= 52/56 by 04/19/16   Status Achieved               Plan - 04/11/16 1145    Clinical Impression Statement Pt has demonstrated excellent progress with PT, with improved R LE strength, coordination and balance. Pt is now able to safely ambulate w/o AD on most surfaces but notes still prefers cane when walking long distances due to fatigue. All goals met except still updating HEP to address home balance program. Prior HEPs verbally reviewed and balance HEP handout provided today based on activities previously attempted during prior therapy sessions and will review these next week. Anticipate D/C from PT at next visit.   PT Treatment/Interventions Patient/family education;Neuromuscular re-education;Balance training;Therapeutic exercise;Therapeutic activities;Functional mobility training;Gait training;Stair training;Manual techniques;Taping;Electrical Stimulation;Moist Heat;ADLs/Self Care Home Management   PT Next Visit Plan Final HEP review, discharge   Consulted and Agree with Plan of Care Patient       Patient will benefit from skilled therapeutic intervention in order to improve the following deficits and impairments:  Decreased strength, Decreased coordination, Decreased balance, Impaired tone, Difficulty walking, Abnormal gait, Pain  Visit Diagnosis: Hemiplegia and hemiparesis following cerebral infarction affecting right dominant side (HCC)  Other lack of coordination  Muscle weakness (generalized)  Right-sided low back pain without sciatica     Problem List Patient Active Problem List   Diagnosis Date Noted  . Right sided weakness 03/26/2016  . 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 Obesity 03/26/2016  . Visit for preventive health examination 01/20/2016  . Daytime somnolence 01/20/2016  . Prostate cancer screening 01/20/2016  . Depression 04/04/2015  . Essential hypertension, benign 04/04/2015  . Chronic fatigue 04/04/2015  . History of stroke 04/04/2015  . Low serum vitamin D 04/04/2015  . Low testosterone 04/04/2015    Percival Spanish, PT, MPT 04/11/2016, 11:58 AM  [facility] [address]  [place], [place], [address] Phone: [phone]   Fax:  [phone]  Name: Antonio Ellison MRN: [id number] Date of Birth: January 19, 1966

## 2016-04-18 ENCOUNTER — Encounter: Payer: Self-pay | Admitting: Physician Assistant

## 2016-04-18 ENCOUNTER — Ambulatory Visit: Payer: PRIVATE HEALTH INSURANCE | Attending: Medical | Admitting: Occupational Therapy

## 2016-04-18 ENCOUNTER — Ambulatory Visit: Payer: PRIVATE HEALTH INSURANCE | Admitting: Physical Therapy

## 2016-04-18 DIAGNOSIS — I69351 Hemiplegia and hemiparesis following cerebral infarction affecting right dominant side: Secondary | ICD-10-CM | POA: Diagnosis present

## 2016-04-18 DIAGNOSIS — R278 Other lack of coordination: Secondary | ICD-10-CM

## 2016-04-18 DIAGNOSIS — M545 Low back pain, unspecified: Secondary | ICD-10-CM

## 2016-04-18 DIAGNOSIS — M6281 Muscle weakness (generalized): Secondary | ICD-10-CM

## 2016-04-18 NOTE — Therapy (Signed)
[facility] [address]  [address] [place], [place], [address] Phone: [phone]   Fax:  [phone]  Occupational Therapy Treatment  Patient Details  Name: Antonio Natalric Ellison MRN: [id number] Date of Birth: September 08, 1965 Referring Provider: Esperanza RichtersEdward Saguier PA-C  Encounter Date: 04/18/2016      OT End of Session - 04/18/16 1230    Visit Number 13   Number of Visits 17   Date for OT Re-Evaluation 04/16/16   Authorization Type [employer]   Authorization Time Period 60 visit limit (PT referral requested) - WEEK 7/8   Authorization - Visit Number 13   Authorization - Number of Visits 30   Activity Tolerance Patient tolerated treatment well      Past Medical History:  Diagnosis Date  . Asthma   . Chronic headaches   . History of chicken pox    3717 Mths old  . Hyperlipidemia   . Hypertension   . Low testosterone   . Stroke [facility]) 2012    Past Surgical History:  Procedure Laterality Date  . ACHILLES TENDON REPAIR     Left Leg    There were no vitals filed for this visit.      Subjective Assessment - 04/18/16 1156    Subjective  My Rt hand still has tingling but no pain   Pertinent History h/o CVA   Patient Stated Goals To get my arms working like they used to   Currently in Pain? No/denies                      OT Treatments/Exercises (OP) - 04/18/16 0001      Shoulder Exercises: ROM/Strengthening   Other ROM/Strengthening Exercises UBE x 5 min. Level 8 for reciprocal movement pattern and strength/endurance     Fine Motor Coordination   Small Pegboard Pt manipulating 5 pegs fingertip to/from palm translation while copying complex design with min drops, and copying design at 100% accuracy   Other Fine Motor Exercises Writing on vertical surface with only min difficulty for coordination, control, and RUE endurance   Other Fine Motor Exercises Gross motor coordination while ambulating: tossing small ball while  alternating Rt/Lt hands with min drops, then with both hands simultaneously with max drops. Dribbling ball while ambulating with min errors.                   OT Short Term Goals - 03/12/16 1347      OT SHORT TERM GOAL #1   Title Independent with initial HEP (All STG's due 03/17/16)    Time 4   Period Weeks   Status Achieved     OT SHORT TERM GOAL #2   Title Pt to perform bathing with only min assist or less   Baseline max assist   Time 4   Period Weeks   Status Achieved     OT SHORT TERM GOAL #3   Title Pt to consistently hook buttons, fastners, cut food, and tie shoes at Mod I level with A/E prn   Time 4   Period Weeks   Status Achieved  except cutting food     OT SHORT TERM GOAL #4   Title Pt to verbalize understanding with pain management strategies for neck and RUE   Time 4   Period Weeks   Status Deferred  no longer needed - pt has had no pain      OT SHORT TERM GOAL #5   Title  Pt to improve RUE function as evidenced by performing 25 or greater on Box & Blocks test   Baseline eval = 18 (Lt = 45)    Time 4   Period Weeks   Status Achieved  03/12/16: 36 BLOCKS           OT Long Term Goals - 03/12/16 1348      OT LONG TERM GOAL #1   Title Independent with updated HEP (All LTG's due 04/16/16)    Time 8   Period Weeks   Status New     OT LONG TERM GOAL #2   Title Improve coordination RUE as evidenced by performing 9 hole peg test in 50 sec. or less   Baseline eval = 86.04 sec (Lt = 25.50 sec)    Time 8   Period Weeks   Status New     OT LONG TERM GOAL #3   Title Improve grip strength Rt hand to 50 lbs or greater to assist with opening jars   Baseline eval = 38 lbs (Lt = 113 lbs)    Time 8   Period Weeks   Status Achieved  03/12/16: 68 LBS     OT LONG TERM GOAL #4   Title Pt to retrieve and replace lightweight object from cabinet RUE requiring 100* shoulder flexion or >   Baseline eval = approx 40* sh. flex   Time 8   Period Weeks   Status  New     OT LONG TERM GOAL #5   Title Pt to perform simple meal prep/light cleaning activities Mod I level with DME prn   Time 8   Period Weeks   Status New               Plan - 04/18/16 1230    Clinical Impression Statement Pt is progressing with coordination and functional balance/ambulation.    Rehab Potential Good   OT Frequency 2x / week   OT Duration 8 weeks   OT Treatment/Interventions Self-care/ADL training;Therapeutic exercise;Functional Mobility Training;Patient/family education;Neuromuscular education;Splinting;Manual Therapy;DME and/or AE instruction;Therapeutic activities;Electrical Stimulation;Parrafin;Cognitive remediation/compensation;Moist Heat;Passive range of motion;Visual/perceptual remediation/compensation   Plan issue theraband HEP, balance while retrieving items in/out of cabinets, beginning assessing goals   Consulted and Agree with Plan of Care Patient      Patient will benefit from skilled therapeutic intervention in order to improve the following deficits and impairments:  Decreased coordination, Decreased range of motion, Decreased endurance, Increased edema, Impaired sensation, Decreased knowledge of precautions, Decreased knowledge of use of DME, Impaired UE functional use, Pain, Decreased mobility, Decreased strength, Impaired vision/preception, Decreased cognition  Visit Diagnosis: Hemiplegia and hemiparesis following cerebral infarction affecting right dominant side (HCC)  Other lack of coordination  Muscle weakness (generalized)    Problem List Patient Active Problem List   Diagnosis Date Noted  . Right sided weakness 03/26/2016  . 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 Obesity 03/26/2016  . Visit for preventive health examination 01/20/2016  . Daytime somnolence 01/20/2016  . Prostate cancer screening 01/20/2016  . Depression 04/04/2015  . Essential hypertension, benign 04/04/2015  . Chronic fatigue  04/04/2015  . History of stroke 04/04/2015  . Low serum vitamin D 04/04/2015  . Low testosterone 04/04/2015    Kelli Churn, OTR/L 04/18/2016, 12:33 PM  [facility] [address]  [address] [place], [place], [address] Phone: [phone]   Fax:  [phone]  Name: Antonio Ellison MRN: [id number] Date of  Birth: 1966-05-18

## 2016-04-18 NOTE — Therapy (Signed)
[facility] [address]  [place], [place], [address] Phone: [phone]   Fax:  [phone]  Physical Therapy Treatment  Patient Details  Name: Antonio Ellison MRN: [id number] Date of Birth: Jun 19, 1966 Referring Provider: Mackie Pai, PA-C  Encounter Date: 04/18/2016      PT End of Session - 04/18/16 1101    Visit Number 16   Number of Visits 16   Date for PT Re-Evaluation 04/19/16   PT Start Time 1101   PT Stop Time 1145   PT Time Calculation (min) 44 min   Activity Tolerance Patient tolerated treatment well   Behavior During Therapy [facility] for tasks assessed/performed      Past Medical History:  Diagnosis Date  . Asthma   . Chronic headaches   . History of chicken pox    33 Mths old  . Hyperlipidemia   . Hypertension   . Low testosterone   . Stroke [facility]) 2012    Past Surgical History:  Procedure Laterality Date  . ACHILLES TENDON REPAIR     Left Leg    There were no vitals filed for this visit.      Subjective Assessment - 04/18/16 1101    Subjective Pt reports he has not had the opportunity to work on the balance HEP over the past week, due to traveling.   Patient Stated Goals "walk w/o a walker"   Currently in Pain? No/denies            [facility] PT Assessment - 04/18/16 1101      Assessment   Medical Diagnosis CVA - R hemiparesis   Onset Date/Surgical Date 01/29/16   Hand Dominance Right   Next MD Visit none scheduled     Observation/Other Assessments   Focus on Therapeutic Outcomes (FOTO)  Neuromuscular disorder 60% (40% limitation)     Strength   Right Hip Flexion 4+/5   Right Hip Extension 4/5   Right Hip ABduction 4+/5   Right Hip ADduction 4/5   Left Hip Flexion 5/5   Left Hip Extension 5/5   Left Hip ABduction 5/5   Left Hip ADduction 5/5   Right Knee Flexion 4+/5   Right Knee Extension 5/5   Left Knee Flexion 5/5   Left Knee Extension 5/5   Right Ankle Dorsiflexion  4+/5   Right Ankle Plantar Flexion 4+/5   Right Ankle Inversion 4/5   Right Ankle Eversion 4+/5   Left Ankle Dorsiflexion 5/5   Left Ankle Plantar Flexion 5/5   Left Ankle Inversion 5/5   Left Ankle Eversion 5/5           Today's Treatment  TherEx Treadmill - 1.8 mph x 4'  Neuro Corner standing on blue foam Airex pad:    Narrow BOS Rhomberg stance       Eyes closed x30"       Visual scanning - Lateral & Vertical x10 each    Semi-tandem BOS Rhomberg stance       Eyes closed x30"       Visual scanning - Lateral & Vertical x10 each    Tandem BOS Rhomberg stance       Eyes closed x10"       Visual scanning - Lateral & Vertical x10 each Corner standing with SLS +/- Rhomberg stance Fwd/Back Tandem gait on firm surface 2 x 10 ft       Toe walking 2 x 10 ft Heel Walking 2 x 10  ft Standing B 4 way SLR with green TB x10 each          PT Education - 04/18/16 1144    Education provided Yes   Education Details SLS + 4 way SLR with green TB   Person(s) Educated Patient   Methods Explanation;Demonstration;Handout   Comprehension Verbalized understanding;Returned demonstration          PT Short Term Goals - 03/26/16 1341      PT SHORT TERM GOAL #1   Title Independent with initial LE strengthening HEP by 03/23/16   Status Achieved     PT SHORT TERM GOAL #2   Title R LE strength grossly 3+/5 to 4-/5 for improved balance and gait stability by 03/23/16   Status Achieved     PT SHORT TERM GOAL #3   Title Pt will safely ambulate on level surfaces with SPC demonstrating adequate R foot clearance to reduce risk for falls by 03/23/16   Status Achieved     PT SHORT TERM GOAL #4   Title Pt will demonstrate reduced risk for falls as evidenced by Merrilee Jansky Balance Scale score >/= 45/56 by 03/23/16   Status Achieved           PT Long Term Goals - 04/18/16 1145      PT LONG TERM GOAL #1   Title Independent with advanced HEP as indicated by 04/19/16   Status Achieved     PT  LONG TERM GOAL #2   Title R LE strength >/= 4/5 for improved balance and gait stability by 04/19/16   Status Achieved     PT LONG TERM GOAL #3   Title Pt will safely ambulate on level surfaces without AD demonstrating normal gait pattern to reduce risk for falls by 04/19/16   Status Achieved     PT LONG TERM GOAL #4   Title Pt will demonstrate low risk for falls as evidenced by Merrilee Jansky Balance Scale score >/= 52/56 by 04/19/16   Status Achieved               Plan - 04/18/16 1145    Clinical Impression Statement Pt has demonstrated excellent progress with PT, with improved R LE strength, coordination and balance. Pt is now able to safely ambulate w/o AD on most surfaces but notes still prefers cane when walking long distances due to fatigue. HEPs reviewed with pt demonstrating all exercises appropriately and verbalizing understanding of home progression. All goals met for this episode, therefore will proceed with discharge from PT.   PT Treatment/Interventions Patient/family education;Neuromuscular re-education;Balance training;Therapeutic exercise;Therapeutic activities;Functional mobility training;Gait training;Stair training;Manual techniques;Taping;Electrical Stimulation;Moist Heat;ADLs/Self Care Home Management   PT Next Visit Plan Discharge   Consulted and Agree with Plan of Care Patient      Patient will benefit from skilled therapeutic intervention in order to improve the following deficits and impairments:  Decreased strength, Decreased coordination, Decreased balance, Impaired tone, Difficulty walking, Abnormal gait, Pain  Visit Diagnosis: Hemiplegia and hemiparesis following cerebral infarction affecting right dominant side (HCC)  Other lack of coordination  Muscle weakness (generalized)  Right-sided low back pain without sciatica     Problem List Patient Active Problem List   Diagnosis Date Noted  . Right sided weakness 03/26/2016  . Cerebrovascular accident (CVA) due  to embolism ([place]) 03/26/2016  . Obesity 03/26/2016  . Visit for preventive health examination 01/20/2016  . Daytime somnolence 01/20/2016  . Prostate cancer screening 01/20/2016  . Depression 04/04/2015  . Essential hypertension, benign 04/04/2015  .  Chronic fatigue 04/04/2015  . History of stroke 04/04/2015  . Low serum vitamin D 04/04/2015  . Low testosterone 04/04/2015    Percival Spanish, PT, MPT  04/18/2016, 1:15 PM  [facility] [address]  [place], [place], [address] Phone: [phone]   Fax:  [phone]  Name: Antonio Ellison MRN: [id number] Date of Birth: 06-30-1966  PHYSICAL THERAPY DISCHARGE SUMMARY  Visits from Start of Care: 16  Current functional level related to goals / functional outcomes:   Refer to above clinical impression   Remaining deficits:   As above. Pt to continue with HEPs.   Education / Equipment:   HEP  Plan: Patient agrees to discharge.  Patient goals were met. Patient is being discharged due to meeting the stated rehab goals.  ?????    Percival Spanish, PT, MPT 04/18/16, 1:16 PM  [facility] [address]  [place], [place], [address] Phone: [phone]   Fax:  [phone]

## 2016-04-23 ENCOUNTER — Ambulatory Visit: Payer: PRIVATE HEALTH INSURANCE | Admitting: Occupational Therapy

## 2016-04-23 DIAGNOSIS — I69351 Hemiplegia and hemiparesis following cerebral infarction affecting right dominant side: Secondary | ICD-10-CM

## 2016-04-23 DIAGNOSIS — R278 Other lack of coordination: Secondary | ICD-10-CM

## 2016-04-23 DIAGNOSIS — M6281 Muscle weakness (generalized): Secondary | ICD-10-CM

## 2016-04-23 NOTE — Patient Instructions (Addendum)
  Strengthening: Resisted Flexion   Hold tubing with __Rt___ arm(s) at side. Pull forward and up. Move shoulder through pain-free range of motion. Repeat __10__ times per set.  Do _2_ sessions per day , every other day   Strengthening: Resisted Extension   Hold tubing in ___Rt__ hand(s), arm forward. Pull arm back, elbow straight. Repeat _10___ times per set. Do _2___ sessions per day, every other day.   Resisted Horizontal Abduction: Bilateral   Sit or stand, tubing in both hands, arms out in front. Keeping arms straight, pinch shoulder blades together and stretch arms out. Repeat _10___ times per set. Do _2___ sessions per day, every other day.   Elbow Flexion: Resisted   With tubing held in __Rt____ hand(s) and other end secured under foot, curl arm up as far as possible. Repeat _10___ times per set. Do _2___ sessions per day, every other day.    Elbow Extension: Resisted   Sit in chair with resistive band secured at armrest (or hold with other hand) and ____Rt___ elbow bent. Straighten elbow. Repeat _10___ times per set.  Do _2___ sessions per day, every other day.

## 2016-04-23 NOTE — Therapy (Signed)
[facility] [address]  [place], [place], [address] Phone: [phone]   Fax:  [phone]  Occupational Therapy Treatment  Patient Details  Name: Antonio Ellison MRN: [id number] Date of Birth: January 28, 1966 Referring Provider: Mackie Pai PA-C  Encounter Date: 04/23/2016      OT End of Session - 04/23/16 1204    Visit Number 14   Number of Visits 17   Date for OT Re-Evaluation 04/16/16   Authorization Type [employer]   Authorization Time Period 60 visit limit (PT referral requested) - WEEK 8/8   Authorization - Visit Number 14   Authorization - Number of Visits 30   OT Start Time 1125   OT Stop Time 1215   OT Time Calculation (min) 50 min   Activity Tolerance Patient tolerated treatment well      Past Medical History:  Diagnosis Date  . Asthma   . Chronic headaches   . History of chicken pox    20 Mths old  . Hyperlipidemia   . Hypertension   . Low testosterone   . Stroke [facility]) 2012    Past Surgical History:  Procedure Laterality Date  . ACHILLES TENDON REPAIR     Left Leg    There were no vitals filed for this visit.      Subjective Assessment - 04/23/16 1128    Subjective  The tingling in my hand bothers me, but coordination is better   Pertinent History h/o CVA   Patient Stated Goals To get my arms working like they used to   Currently in Pain? No/denies            [facility] OT Assessment - 04/23/16 0001      Coordination   Right 9 Hole Peg Test 26 sec.    Box and Blocks Rt = 47      Hand Function   Right Hand Grip (lbs) 95 lbs                  OT Treatments/Exercises (OP) - 04/23/16 0001      ADLs   Functional Mobility Practiced retreiving 5 lb. objects out of low cabinets and high cabinets requiring 150* sh. flexion   ADL Comments Began assessing LTG's and progress to date in prep for d/c next session. Pt improved significantly on coordination, grip strength, and RUE  ROM/strength     Shoulder Exercises: ROM/Strengthening   UBE (Upper Arm Bike) x 10 minutes Level 10 resistance    Other ROM/Strengthening Exercises Issued theraband HEP with red resistance band - see pt instructions. Pt demo each x 10 reps                OT Education - 04/23/16 1155    Education provided Yes   Education Details Theraband HEP    Person(s) Educated Patient   Methods Explanation;Demonstration;Handout   Comprehension Verbalized understanding;Returned demonstration;Need further instruction          OT Short Term Goals - 04/23/16 1205      OT SHORT TERM GOAL #1   Title Independent with initial HEP (All STG's due 03/17/16)    Time 4   Period Weeks   Status Achieved     OT SHORT TERM GOAL #2   Title Pt to perform bathing with only min assist or less   Baseline max assist   Time 4   Period Weeks   Status Achieved  independent     OT SHORT  TERM GOAL #3   Title Pt to consistently hook buttons, fastners, cut food, and tie shoes at Mod I level with A/E prn   Time 4   Period Weeks   Status Achieved     OT SHORT TERM GOAL #4   Title Pt to verbalize understanding with pain management strategies for neck and RUE   Time 4   Period Weeks   Status Deferred  no longer needed - pt has had no pain      OT SHORT TERM GOAL #5   Title Pt to improve RUE function as evidenced by performing 25 or greater on Box & Blocks test   Baseline eval = 18 (Lt = 45)    Time 4   Period Weeks   Status Achieved  03/12/16: 36 BLOCKS, 04/23/16: 47 blocks           OT Long Term Goals - 04/23/16 1206      OT LONG TERM GOAL #1   Title Independent with updated HEP (All LTG's due 04/16/16)    Time 8   Period Weeks   Status On-going     OT LONG TERM GOAL #2   Title Improve coordination RUE as evidenced by performing 9 hole peg test in 50 sec. or less   Baseline eval = 86.04 sec (Lt = 25.50 sec)    Time 8   Period Weeks   Status Achieved  04/23/16: 26 sec     OT LONG TERM  GOAL #3   Title Improve grip strength Rt hand to 50 lbs or greater to assist with opening jars   Baseline eval = 38 lbs (Lt = 113 lbs)    Time 8   Period Weeks   Status Achieved  03/12/16: 68 LBS. 04/23/16: 95 lbs     OT LONG TERM GOAL #4   Title Pt to retrieve and replace lightweight object from cabinet RUE requiring 100* shoulder flexion or >   Baseline eval = approx 40* sh. flex   Time 8   Period Weeks   Status Achieved  04/23/16: 150* sh. flexion with 5 lb. object     OT LONG TERM GOAL #5   Title Pt to perform simple meal prep/light cleaning activities Mod I level with DME prn   Time 8   Period Weeks   Status Achieved  per pt report               Plan - 04/23/16 1208    Clinical Impression Statement Pt with significantly improved RUE ROM, strength, coordination, and grip strength. Pt met 4/5 LTG's at this time. Only needs review to meet LTG #1.    Rehab Potential Good   OT Frequency 2x / week   OT Duration 8 weeks   OT Treatment/Interventions Self-care/ADL training;Therapeutic exercise;Functional Mobility Training;Patient/family education;Neuromuscular education;Splinting;Manual Therapy;DME and/or AE instruction;Therapeutic activities;Electrical Stimulation;Parrafin;Cognitive remediation/compensation;Moist Heat;Passive range of motion;Visual/perceptual remediation/compensation   Plan Review theraband HEP, coordination exercises, d/c next session   Consulted and Agree with Plan of Care Patient      Patient will benefit from skilled therapeutic intervention in order to improve the following deficits and impairments:  Decreased coordination, Decreased range of motion, Decreased endurance, Increased edema, Impaired sensation, Decreased knowledge of precautions, Decreased knowledge of use of DME, Impaired UE functional use, Pain, Decreased mobility, Decreased strength, Impaired vision/preception, Decreased cognition  Visit Diagnosis: Hemiplegia and hemiparesis following  cerebral infarction affecting right dominant side (HCC)  Other lack of coordination  Muscle weakness (generalized)  Problem List Patient Active Problem List   Diagnosis Date Noted  . Right sided weakness 03/26/2016  . 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 Obesity 03/26/2016  . Visit for preventive health examination 01/20/2016  . Daytime somnolence 01/20/2016  . Prostate cancer screening 01/20/2016  . Depression 04/04/2015  . Essential hypertension, benign 04/04/2015  . Chronic fatigue 04/04/2015  . History of stroke 04/04/2015  . Low serum vitamin D 04/04/2015  . Low testosterone 04/04/2015    Carey Bullocks, OTR/L 04/23/2016, 12:10 PM  [facility] [address]  [place], [place], [address] Phone: [phone]   Fax:  [phone]  Name: Antonio Ellison MRN: [id number] Date of Birth: [birth year]

## 2016-04-25 ENCOUNTER — Ambulatory Visit: Payer: PRIVATE HEALTH INSURANCE | Admitting: Occupational Therapy

## 2016-04-25 DIAGNOSIS — M6281 Muscle weakness (generalized): Secondary | ICD-10-CM

## 2016-04-25 DIAGNOSIS — R278 Other lack of coordination: Secondary | ICD-10-CM

## 2016-04-25 DIAGNOSIS — I69351 Hemiplegia and hemiparesis following cerebral infarction affecting right dominant side: Secondary | ICD-10-CM

## 2016-04-25 NOTE — Therapy (Signed)
[facility] [address]  [place], [place], [address] Phone: [phone]   Fax:  [phone]  Occupational Therapy Treatment  Patient Details  Name: Antonio Ellison MRN: [id number] Date of Birth: 14-Aug-1965 Referring Provider: Mackie Pai PA-C  Encounter Date: 04/25/2016      OT End of Session - 04/25/16 1218    Visit Number 15   Number of Visits 17   Date for OT Re-Evaluation 04/16/16   Authorization Type [employer]   Authorization Time Period 60 visit limit (PT referral requested) - WEEK 8/8   Authorization - Visit Number 15   Authorization - Number of Visits 30   OT Start Time 0626   OT Stop Time 1225   OT Time Calculation (min) 40 min   Activity Tolerance Patient tolerated treatment well      Past Medical History:  Diagnosis Date  . Asthma   . Chronic headaches   . History of chicken pox    87 Mths old  . Hyperlipidemia   . Hypertension   . Low testosterone   . Stroke [facility]) 2012    Past Surgical History:  Procedure Laterality Date  . ACHILLES TENDON REPAIR     Left Leg    There were no vitals filed for this visit.      Subjective Assessment - 04/25/16 1150    Subjective  I feel good about d/c   Pertinent History h/o CVA   Patient Stated Goals To get my arms working like they used to   Currently in Pain? No/denies                      OT Treatments/Exercises (OP) - 04/25/16 0001      ADLs   ADL Comments Reviewed progress/LTG's with pt and pt's mother. Both were very pleased with his progress     Shoulder Exercises: ROM/Strengthening   Other ROM/Strengthening Exercises Reviewed theraband HEP - Pt demo each x 10 reps     Fine Motor Coordination   Other Fine Motor Exercises Writing on vertical surface with only min difficulty for coordination, control, and RUE endurance                OT Education - 04/25/16 1217    Education provided Yes   Education Details  review of theraband HEP, discussed progress, LTG's and d/c   Person(s) Educated Patient;Parent(s)   Methods Explanation;Demonstration   Comprehension Verbalized understanding;Returned demonstration          OT Short Term Goals - 04/23/16 1205      OT SHORT TERM GOAL #1   Title Independent with initial HEP (All STG's due 03/17/16)    Time 4   Period Weeks   Status Achieved     OT SHORT TERM GOAL #2   Title Pt to perform bathing with only min assist or less   Baseline max assist   Time 4   Period Weeks   Status Achieved  independent     OT SHORT TERM GOAL #3   Title Pt to consistently hook buttons, fastners, cut food, and tie shoes at Mod I level with A/E prn   Time 4   Period Weeks   Status Achieved     OT SHORT TERM GOAL #4   Title Pt to verbalize understanding with pain management strategies for neck and RUE   Time 4   Period Weeks   Status Deferred  no longer needed -  pt has had no pain      OT SHORT TERM GOAL #5   Title Pt to improve RUE function as evidenced by performing 25 or greater on Box & Blocks test   Baseline eval = 18 (Lt = 45)    Time 4   Period Weeks   Status Achieved  03/12/16: 36 BLOCKS, 04/23/16: 47 blocks           OT Long Term Goals - 04/25/16 1221      OT LONG TERM GOAL #1   Title Independent with updated HEP (All LTG's due 04/16/16)    Time 8   Period Weeks   Status Achieved     OT LONG TERM GOAL #2   Title Improve coordination RUE as evidenced by performing 9 hole peg test in 50 sec. or less   Baseline eval = 86.04 sec (Lt = 25.50 sec)    Time 8   Period Weeks   Status Achieved  04/23/16: 26 sec     OT LONG TERM GOAL #3   Title Improve grip strength Rt hand to 50 lbs or greater to assist with opening jars   Baseline eval = 38 lbs (Lt = 113 lbs)    Time 8   Period Weeks   Status Achieved  03/12/16: 68 LBS. 04/23/16: 95 lbs     OT LONG TERM GOAL #4   Title Pt to retrieve and replace lightweight object from cabinet RUE requiring  100* shoulder flexion or >   Baseline eval = approx 40* sh. flex   Time 8   Period Weeks   Status Achieved  04/23/16: 150* sh. flexion with 5 lb. object     OT LONG TERM GOAL #5   Title Pt to perform simple meal prep/light cleaning activities Mod I level with DME prn   Time 8   Period Weeks   Status Achieved  per pt report               Plan - 04/25/16 1221    Clinical Impression Statement Pt has met all LTG's and has shown significant progress with RUE ROM, strength, endurance, coordination and increased independence with all ADLS. Pt/family very pleased with progress   Plan D/C O.Donnajean Lopes and Agree with Plan of Care Patient;Family member/caregiver   Family Member Consulted Mom       Patient will benefit from skilled therapeutic intervention in order to improve the following deficits and impairments:     Visit Diagnosis: Hemiplegia and hemiparesis following cerebral infarction affecting right dominant side ([place])  Other lack of coordination  Muscle weakness (generalized)    Problem List Patient Active Problem List   Diagnosis Date Noted  . Right sided weakness 03/26/2016  . 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 Obesity 03/26/2016  . Visit for preventive health examination 01/20/2016  . Daytime somnolence 01/20/2016  . Prostate cancer screening 01/20/2016  . Depression 04/04/2015  . Essential hypertension, benign 04/04/2015  . Chronic fatigue 04/04/2015  . History of stroke 04/04/2015  . Low serum vitamin D 04/04/2015  . Low testosterone 04/04/2015    OCCUPATIONAL THERAPY DISCHARGE SUMMARY  Visits from Start of Care: 15  Current functional level related to goals / functional outcomes: SEE ABOVE FOR PROGRESS AND OBJECTIVE MEASURES   Remaining deficits: Mild coordination deficits Rt hand Decreased sensation Rt hand   Education / Equipment: HEP's, A/E recommendations  Plan: Patient agrees to discharge.  Patient  goals  were met. Patient is being discharged due to meeting the stated rehab goals.  ?????       Carey Bullocks, OTR/L 04/25/2016, 12:23 PM  [facility] [address]  [place], [place], [address] Phone: [phone]   Fax:  [phone]  Name: Antonio Ellison MRN: [id number] Date of Birth: 30-Jul-1966

## 2016-04-30 ENCOUNTER — Encounter: Payer: Self-pay | Admitting: Cardiology

## 2016-05-01 ENCOUNTER — Ambulatory Visit: Payer: PRIVATE HEALTH INSURANCE | Admitting: Cardiology

## 2016-05-01 ENCOUNTER — Encounter: Payer: Self-pay | Admitting: Cardiology

## 2016-05-01 NOTE — Progress Notes (Deleted)
Electrophysiology Office Note   Date:  05/01/2016   ID:  Antonio Ellison, DOB [birth year], MRN [id number]  PCP:  Piedad Climes, PA-C  Primary Electrophysiologist:  Will Jorja Loa, MD    No chief complaint on file.    History of Present Illness: Antonio Ellison is a 50 y.o. male who presents today for electrophysiology evaluation.   History of CVA with right sided weakness on 01/29/16.   Today, he denies*** symptoms of palpitations, chest pain, shortness of breath, orthopnea, PND, lower extremity edema, claudication, dizziness, presyncope, syncope, bleeding, or neurologic sequela. The patient is tolerating medications without difficulties and is otherwise without complaint today.    Past Medical History:  Diagnosis Date  . Asthma   . Chronic headaches   . History of chicken pox    65 Mths old  . Hyperlipidemia   . Hypertension   . Low testosterone   . Stroke [facility]) 2012   Past Surgical History:  Procedure Laterality Date  . ACHILLES TENDON REPAIR     Left Leg     Current Outpatient Prescriptions  Medication Sig Dispense Refill  . amLODipine (NORVASC) 10 MG tablet Take 1 tablet (10 mg total) by mouth daily. 30 tablet 5  . atorvastatin (LIPITOR) 80 MG tablet Take 1 tablet (80 mg total) by mouth daily. 30 tablet 5  . buPROPion (WELLBUTRIN SR) 150 MG 12 hr tablet Take 1 tablet (150 mg total) by mouth 2 (two) times daily. 180 tablet 1  . clopidogrel (PLAVIX) 75 MG tablet Take 75 mg by mouth daily.    . cyclobenzaprine (FLEXERIL) 5 MG tablet Take 5 mg by mouth 3 (three) times daily as needed for muscle spasms.    . fexofenadine (ALLEGRA) 180 MG tablet Take 180 mg by mouth daily as needed for allergies or rhinitis. Reported on 11/16/2015    . lisinopril (PRINIVIL,ZESTRIL) 10 MG tablet Take 1 tablet (10 mg total) by mouth daily. 30 tablet 3  . sertraline (ZOLOFT) 50 MG tablet Take 1 tablet (50 mg total) by mouth daily. 90 tablet 1   No current facility-administered medications  for this visit.     Allergies:   Review of patient's allergies indicates no known allergies.   Social History:  The patient  reports that he has never smoked. He has never used smokeless tobacco. He reports that he does not drink alcohol or use drugs.   Family History:  The patient's family history includes Alzheimer's disease in his maternal grandmother; Aneurysm in his paternal grandmother; Diabetes in his father; Healthy in his daughter, sister, and son; Heart disease in his maternal grandmother; Hypertension in his father, maternal aunt, maternal grandfather, paternal grandfather, and paternal grandmother; Kidney disease in his maternal aunt; Liver cancer in his maternal aunt; Sinusitis in his mother; Stroke in his father and paternal grandfather.    ROS:  Please see the history of present illness.   Otherwise, review of systems is positive for ***.   All other systems are reviewed and negative.    PHYSICAL EXAM: VS:  There were no vitals taken for this visit. , BMI There is no height or weight on file to calculate BMI. GEN: Well nourished, well developed, in no acute distress  HEENT: normal  Neck: no JVD, carotid bruits, or masses Cardiac: ***RRR; no murmurs, rubs, or gallops,no edema  Respiratory:  clear to auscultation bilaterally, normal work of breathing GI: soft, nontender, nondistended, + BS MS: no deformity or atrophy  Skin: warm and dry Neuro:  Strength and sensation are intact Psych: euthymic mood, full affect  EKG:  EKG is ordered today. Personal review of the ekg ordered shows ***  Recent Labs: 01/24/2016: TSH 1.41 02/07/2016: Hemoglobin 14.1; Platelets 193.0 Repeated and verified X2.; Pro B Natriuretic peptide (BNP) 11.0 03/26/2016: ALT 24; BUN 17; Creatinine, Ser 1.27; Potassium 3.9; Sodium 137    Lipid Panel     Component Value Date/Time   CHOL 216 (H) 01/24/2016 0915   TRIG 97.0 01/24/2016 0915   HDL 47.10 01/24/2016 0915   CHOLHDL 5 01/24/2016 0915   VLDL  19.4 01/24/2016 0915   LDLCALC 150 (H) 01/24/2016 0915     Wt Readings from Last 3 Encounters:  04/06/16 233 lb 9.6 oz (106 kg)  03/26/16 236 lb (107 kg)  02/06/16 250 lb 12.8 oz (113.8 kg)      Other studies Reviewed: Additional studies/ records that were reviewed today include: Epic notes Review of the above records today demonstrates: hx CVA   ASSESSMENT AND PLAN:  1.  CVA:   2. Hypertension:  3. Hyperlipidemia:    Current medicines are reviewed at length with the patient today.   The patient {ACTIONS; HAS/DOES NOT HAVE:19233} concerns regarding his medicines.  The following changes were made today:  {NONE DEFAULTED:18576::"none"}  Labs/ tests ordered today include: *** No orders of the defined types were placed in this encounter.    Disposition:   FU with *** {gen number [id number]}0-10:[id number]} {Days to years:10300}  Signed, Will Jorja LoaMartin Camnitz, MD  05/01/2016 11:36 AM     [facility] [address] [place] [address] [phone] (office) [phone] (fax)

## 2016-05-02 ENCOUNTER — Other Ambulatory Visit: Payer: Self-pay | Admitting: Neurology

## 2016-05-02 ENCOUNTER — Ambulatory Visit (INDEPENDENT_AMBULATORY_CARE_PROVIDER_SITE_OTHER): Payer: PRIVATE HEALTH INSURANCE

## 2016-05-02 DIAGNOSIS — I6319 Cerebral infarction due to embolism of other precerebral artery: Secondary | ICD-10-CM

## 2016-05-02 DIAGNOSIS — R42 Dizziness and giddiness: Secondary | ICD-10-CM | POA: Diagnosis not present

## 2016-05-21 ENCOUNTER — Ambulatory Visit (HOSPITAL_BASED_OUTPATIENT_CLINIC_OR_DEPARTMENT_OTHER): Payer: PRIVATE HEALTH INSURANCE | Attending: Pulmonary Disease | Admitting: Pulmonary Disease

## 2016-05-21 VITALS — Ht 72.0 in | Wt 220.0 lb

## 2016-05-21 DIAGNOSIS — G4761 Periodic limb movement disorder: Secondary | ICD-10-CM | POA: Insufficient documentation

## 2016-05-21 DIAGNOSIS — R0683 Snoring: Secondary | ICD-10-CM

## 2016-05-21 DIAGNOSIS — Z79899 Other long term (current) drug therapy: Secondary | ICD-10-CM | POA: Diagnosis not present

## 2016-05-21 DIAGNOSIS — G4733 Obstructive sleep apnea (adult) (pediatric): Secondary | ICD-10-CM | POA: Diagnosis not present

## 2016-05-22 ENCOUNTER — Encounter (HOSPITAL_BASED_OUTPATIENT_CLINIC_OR_DEPARTMENT_OTHER): Payer: Self-pay

## 2016-05-28 ENCOUNTER — Other Ambulatory Visit: Payer: Self-pay | Admitting: Physician Assistant

## 2016-06-06 ENCOUNTER — Telehealth: Payer: Self-pay | Admitting: Pulmonary Disease

## 2016-06-06 ENCOUNTER — Encounter (HOSPITAL_BASED_OUTPATIENT_CLINIC_OR_DEPARTMENT_OTHER): Payer: Self-pay

## 2016-06-06 DIAGNOSIS — G4761 Periodic limb movement disorder: Secondary | ICD-10-CM

## 2016-06-06 DIAGNOSIS — G4733 Obstructive sleep apnea (adult) (pediatric): Secondary | ICD-10-CM | POA: Insufficient documentation

## 2016-06-06 HISTORY — DX: Obstructive sleep apnea (adult) (pediatric): G47.33

## 2016-06-06 NOTE — Telephone Encounter (Signed)
PSG 05/21/16 >> AHI 9.1, SpO2 low 87%, REM AHI 74.1, Supine AHI 43.2, PLMI 18.59   Will have my nurse inform pt that sleep study shows mild sleep apnea.  Options are 1) CPAP now, 2) ROV first.  If He is agreeable to CPAP, then please send order for auto CPAP range 5 to 15 cm H2O with heated humidity and mask of choice.  Have download sent 1 month after starting CPAP and set up ROV 2 months after starting CPAP.  ROV can be with me or NP.

## 2016-06-06 NOTE — Procedures (Signed)
   Patient Name: Antonio Ellison, Dre[place] Study Date: 05/21/2016 Gender: Male D.O.B: 1966/05/03 Age (years): 49 Referring Provider: Coralyn HellingVineet Ermina Oberman MD, ABSM Height (inches): 72 Interpreting Physician: Coralyn HellingVineet Maddilyn Campus MD, ABSM Weight (lbs): 220 RPSGT: Antonio Ellison, Antonio Ellison BMI: 30 MRN: [id number] Neck Size: 18.00 CLINICAL INFORMATION Sleep Study Type: NPSG   Indication for sleep study: Snoring   Epworth Sleepiness Score: 9   SLEEP STUDY TECHNIQUE As per the AASM Manual for the Scoring of Sleep and Associated Events v2.3 (April 2016) with a hypopnea requiring 4% desaturations. The channels recorded and monitored were frontal, central and occipital EEG, electrooculogram (EOG), submentalis EMG (chin), nasal and oral airflow, thoracic and abdominal wall motion, anterior tibialis EMG, snore microphone, electrocardiogram, and pulse oximetry.  MEDICATIONS Medications self-administered by patient taken the night of the study : ATORVASTATIN, BUPROPION  SLEEP ARCHITECTURE The study was initiated at 10:08:34 PM and ended at 4:15:31 AM. Sleep onset time was 137.7 minutes and the sleep efficiency was 48.4%. The total sleep time was 177.5 minutes. Stage REM latency was 104.5 minutes. The patient spent 18.31% of the night in stage N1 sleep, 72.11% in stage N2 sleep, 0.00% in stage N3 and 9.58% in REM. Alpha intrusion was absent. Supine sleep was 21.13%.  RESPIRATORY PARAMETERS The overall apnea/hypopnea index (AHI) was 9.1 per hour. There were 25 total apneas, including 25 obstructive, 0 central and 0 mixed apneas. There were 2 hypopneas and 7 RERAs. The AHI during Stage REM sleep was 74.1 per hour. AHI while supine was 43.2 per hour. The mean oxygen saturation was 95.91%. The minimum SpO2 during sleep was 87.00%. Loud snoring was noted during this study.  CARDIAC DATA The 2 lead EKG demonstrated sinus rhythm. The mean heart rate was 59.70 beats per minute. Other EKG findings include: None.  LEG MOVEMENT DATA The  total PLMS were 55 with a resulting PLMS index of 18.59. Associated arousal with leg movement index was 0.7 .  IMPRESSIONS - This study shows mild obstructive sleep apnea with an AHI of 9.1 and SpO2 low of 87%.  He had a significant positional and REM effect. - He had a mild increase in periodic limb movement index.  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  [Electronically signed] 06/06/2016 09:10 AM  Coralyn HellingVineet Kristian Mogg MD, ABSM Diplomate, American Board of Sleep Medicine   NPI: [id number]

## 2016-06-08 NOTE — Telephone Encounter (Signed)
ATC, Mailbox full, WCB

## 2016-06-12 ENCOUNTER — Ambulatory Visit (INDEPENDENT_AMBULATORY_CARE_PROVIDER_SITE_OTHER): Payer: PRIVATE HEALTH INSURANCE | Admitting: Physician Assistant

## 2016-06-12 ENCOUNTER — Encounter: Payer: Self-pay | Admitting: Physician Assistant

## 2016-06-12 VITALS — BP 118/92 | HR 65 | Temp 97.9°F | Resp 16 | Ht 73.0 in | Wt 237.4 lb

## 2016-06-12 DIAGNOSIS — R21 Rash and other nonspecific skin eruption: Secondary | ICD-10-CM

## 2016-06-12 DIAGNOSIS — S91001A Unspecified open wound, right ankle, initial encounter: Secondary | ICD-10-CM

## 2016-06-12 LAB — CBC
HEMATOCRIT: 37.8 % — AB (ref 39.0–52.0)
HEMOGLOBIN: 12.8 g/dL — AB (ref 13.0–17.0)
MCHC: 33.7 g/dL (ref 30.0–36.0)
MCV: 83.1 fl (ref 78.0–100.0)
Platelets: 216 10*3/uL (ref 150.0–400.0)
RBC: 4.55 Mil/uL (ref 4.22–5.81)
RDW: 15.3 % (ref 11.5–15.5)
WBC: 7.4 10*3/uL (ref 4.0–10.5)

## 2016-06-12 LAB — SEDIMENTATION RATE: Sed Rate: 6 mm/hr (ref 0–20)

## 2016-06-12 MED ORDER — PREDNISONE 10 MG PO TABS: ORAL_TABLET | ORAL | 0 refills | Status: DC

## 2016-06-12 MED ORDER — MUPIROCIN 2 % EX OINT: 1.0000 "application " | TOPICAL_OINTMENT | Freq: Two times a day (BID) | CUTANEOUS | 0 refills | Status: AC

## 2016-06-12 NOTE — Patient Instructions (Signed)
Please go to the lab for blood work.  I will call you with your results.  Please take the medication as directed. This will hopefully help with rash and itch. Cold baths will help with itching. OTC Sarna lotion will also help with itch.  Keep an eye out for any new items you are using that could be the culprit.   I cannot refer you to a specialist because [place]. I want you to follow-up with me next Monday for repeat assessment. If area is not resolving, I will need to biopsy one of the lesions.   For the area on the ankle, please keep clean and dry.  Keep covered as directed. Avoid wearing shoes that rub and irritate the area.   Continue chronic medications as directed.  Again Follow-up with me on Monday.

## 2016-06-12 NOTE — Progress Notes (Signed)
Patient presents to clinic today c/o rash of neck, torso, abdomen and bilateral lower and upper extremities, starting about 2.5 weeks ago. Endorses rash initially was patches on arms and chest, starting after holter monitor placement. Has worsened since that time. Denies new soap. Lotions, detergent or hygiene products. Denies new pet. Denies new foods. Denies pain but rash is significantly pruritic. Denies recent travel or sick contact. Denies fever, chills, malaise or fatigue. Notes history of eczema but states this feels markedly different.  Patient also notes area of excoriated rash of R medial ankle that has become a sore and tender. Denies redness. Notes the area weeps somewhat but denies pustular drainage. Denies known trauma or injury to the area.  Past Medical History:  Diagnosis Date  . Asthma   . Chronic headaches   . History of chicken pox    73 Mths old  . Hyperlipidemia   . Hypertension   . Low testosterone   . OSA (obstructive sleep apnea) 06/06/2016  . Stroke [facility]) 2012    Current Outpatient Prescriptions on File Prior to Visit  Medication Sig Dispense Refill  . amLODipine (NORVASC) 10 MG tablet Take 1 tablet (10 mg total) by mouth daily. 30 tablet 5  . atorvastatin (LIPITOR) 80 MG tablet Take 1 tablet (80 mg total) by mouth daily. 30 tablet 5  . buPROPion (WELLBUTRIN SR) 150 MG 12 hr tablet Take 1 tablet (150 mg total) by mouth 2 (two) times daily. 180 tablet 1  . clopidogrel (PLAVIX) 75 MG tablet Take 75 mg by mouth daily.    . cyclobenzaprine (FLEXERIL) 5 MG tablet Take 5 mg by mouth 3 (three) times daily as needed for muscle spasms.    . fexofenadine (ALLEGRA) 180 MG tablet Take 180 mg by mouth daily as needed for allergies or rhinitis. Reported on 11/16/2015    . lisinopril (PRINIVIL,ZESTRIL) 10 MG tablet TAKE 1 TABLET (10 MG TOTAL) BY MOUTH DAILY. 30 tablet 3  . sertraline (ZOLOFT) 50 MG tablet Take 1 tablet (50 mg total) by mouth daily. 90 tablet 1   No  current facility-administered medications on file prior to visit.     No Known Allergies  Family History  Problem Relation Age of Onset  . Hypertension Father   . Stroke Father     Late 19s  . Diabetes Father   . Sinusitis Mother   . Kidney disease Maternal Aunt   . Hypertension Maternal Aunt   . Liver cancer Maternal Aunt   . Hypertension Paternal Grandmother   . Aneurysm Paternal Grandmother   . Hypertension Maternal Grandfather   . Alzheimer's disease Maternal Grandmother   . Heart disease Maternal Grandmother   . Hypertension Paternal Grandfather   . Stroke Paternal Grandfather   . Healthy Sister   . Healthy Daughter   . Healthy Son     Social History   Social History  . Marital status: Married    Spouse name: N/A  . Number of children: N/A  . Years of education: N/A   Occupational History  . disabled/unemployed    Social History Main Topics  . Smoking status: Never Smoker  . Smokeless tobacco: Never Used  . Alcohol use No     Comment: Former Use  . Drug use: No  . Sexual activity: Not Currently   Other Topics Concern  . None   Social History Narrative  . None   Review of Systems - See HPI.  All other ROS are negative.  BP (!) 118/92 (BP Location: Left Arm, Patient Position: Sitting, Cuff Size: Large)   Pulse 65   Temp 97.9 F (36.6 C) (Oral)   Resp 16   Ht _0  (1.854 m)   Wt 237 lb 6 oz (107.7 kg)   SpO2 99%   BMI 31.32 kg/m   Physical Exam  Constitutional: He is oriented to person, place, and time and well-developed, well-nourished, and in no distress.  HENT:  Head: Normocephalic and atraumatic.  Eyes: Conjunctivae are normal.  Neck: Neck supple.  Cardiovascular: Normal rate, regular rhythm, normal heart sounds and intact distal pulses.   Pulmonary/Chest: Effort normal and breath sounds normal. No respiratory distress. He has no wheezes. He has no rales. He exhibits no tenderness.  Musculoskeletal:       Feet:  Neurological: He is  alert and oriented to person, place, and time.  Skin: Rash noted. Rash is maculopapular.  Maculopapular rash noted of bilateral upper and lower extremities. Also noted on torso in with some scattered patches of chest without scaling or bulla. Rash spares hands and feet.  Psychiatric: Affect normal.  Vitals reviewed.   Recent Results (from the past 2160 hour(s))  Comprehensive metabolic panel     Status: None   Collection Time: 03/26/16 11:03 AM  Result Value Ref Range   Sodium 137 135 - 145 mEq/L   Potassium 3.9 3.5 - 5.1 mEq/L   Chloride 103 96 - 112 mEq/L   CO2 27 19 - 32 mEq/L   Glucose, Bld 96 70 - 99 mg/dL   BUN 17 6 - 23 mg/dL   Creatinine, Ser 1.27 0.40 - 1.50 mg/dL   Total Bilirubin 0.4 0.2 - 1.2 mg/dL   Alkaline Phosphatase 98 39 - 117 U/L   AST 19 0 - 37 U/L   ALT 24 0 - 53 U/L   Total Protein 7.8 6.0 - 8.3 g/dL   Albumin 4.3 3.5 - 5.2 g/dL   Calcium 9.8 8.4 - 10.5 mg/dL   GFR 76.94 >60.00 mL/min   Assessment/Plan: 1. Rash and nonspecific skin eruption Unclear etiology. Some evidence of allergic dermatitis. Other patches seem psoriatic in nature. Will begin prednisone taper. Patient to avoid potential triggers which were reviewed. Will check CBC and ESR today. Attempted to refer to Dermatology but patient with  - Ambulatory referral to Dermatology - predniSONE (DELTASONE) 10 MG tablet; Take 40 mg (4 tablets) daily for 3 days, then 3 tablets for 3 days, then 2 tablets x 3 days then 1 tablet x 3 days.  Dispense: 30 tablet; Refill: 0 - Sed Rate (ESR) - CBC  2. Wound of right ankle, initial encounter Superficial worsened with excoriation. No active infection noted. Culture sent to confirm. Rx mupirocin to the area. Supportive measures and wound care discussed. Wound was cleaned and dressed in office. FU scheduled. Alarm signs/symptoms reviewed with patient that would prompt emergent assessment.  - mupirocin ointment (BACTROBAN) 2 %; Place 1 application into the nose 2 (two)  times daily.  Dispense: 22 g; Refill: 0 - Wound culture   Leeanne Rio, PA-C

## 2016-06-12 NOTE — Progress Notes (Signed)
Pre visit review using our clinic review tool, if applicable. No additional management support is needed unless otherwise documented below in the visit note/SLS  

## 2016-06-16 LAB — WOUND CULTURE

## 2016-06-17 NOTE — Progress Notes (Signed)
Patient presents to clinic today for follow-up of rash/nonpecific skin eruption s/p treatment with prednisone taper. Patient also following up for reassessment of superficial wound of R ankle. Patient endorses taking prednisone taper as directed with marked improvement in rash. Notes is completely resolved from his arms and legs. Still small area on chest but improving daily. Denies new or worsening symptoms. In regards to the ankle, patient endorses keeping clean and dry. Wound culture obtained and revealed abundant MSSA and moderate Group B strep. Due to sensitivities, was started on Levaquin. Has not started yet but is picking up today. Denies fever, chills, malaise. Endorses drainage from wound has stopped. Denies pain or decreased ROM.  Patient also presents for removal of two skin tags -- one of R shoulder and one of thoracic back.  Past Medical History:  Diagnosis Date  . Asthma   . Chronic headaches   . History of chicken pox    9 Mths old  . Hyperlipidemia   . Hypertension   . Low testosterone   . OSA (obstructive sleep apnea) 06/06/2016  . Stroke [facility]) 2012    Current Outpatient Prescriptions on File Prior to Visit  Medication Sig Dispense Refill  . amLODipine (NORVASC) 10 MG tablet Take 1 tablet (10 mg total) by mouth daily. 30 tablet 5  . atorvastatin (LIPITOR) 80 MG tablet Take 1 tablet (80 mg total) by mouth daily. 30 tablet 5  . buPROPion (WELLBUTRIN SR) 150 MG 12 hr tablet Take 1 tablet (150 mg total) by mouth 2 (two) times daily. 180 tablet 1  . clopidogrel (PLAVIX) 75 MG tablet Take 75 mg by mouth daily.    . cyclobenzaprine (FLEXERIL) 5 MG tablet Take 5 mg by mouth 3 (three) times daily as needed for muscle spasms.    . fexofenadine (ALLEGRA) 180 MG tablet Take 180 mg by mouth daily as needed for allergies or rhinitis. Reported on 11/16/2015    . lisinopril (PRINIVIL,ZESTRIL) 10 MG tablet TAKE 1 TABLET (10 MG TOTAL) BY MOUTH DAILY. 30 tablet 3  . mupirocin ointment  (BACTROBAN) 2 % Place 1 application into the nose 2 (two) times daily. 22 g 0  . predniSONE (DELTASONE) 10 MG tablet Take 40 mg (4 tablets) daily for 3 days, then 3 tablets for 3 days, then 2 tablets x 3 days then 1 tablet x 3 days. 30 tablet 0  . sertraline (ZOLOFT) 50 MG tablet Take 1 tablet (50 mg total) by mouth daily. 90 tablet 1   No current facility-administered medications on file prior to visit.     No Known Allergies  Family History  Problem Relation Age of Onset  . Hypertension Father   . Stroke Father     Late 48s  . Diabetes Father   . Sinusitis Mother   . Kidney disease Maternal Aunt   . Hypertension Maternal Aunt   . Liver cancer Maternal Aunt   . Hypertension Paternal Grandmother   . Aneurysm Paternal Grandmother   . Hypertension Maternal Grandfather   . Alzheimer's disease Maternal Grandmother   . Heart disease Maternal Grandmother   . Hypertension Paternal Grandfather   . Stroke Paternal Grandfather   . Healthy Sister   . Healthy Daughter   . Healthy Son     Social History   Social History  . Marital status: Married    Spouse name: N/A  . Number of children: N/A  . Years of education: N/A   Occupational History  . disabled/unemployed  Social History Main Topics  . Smoking status: Never Smoker  . Smokeless tobacco: Never Used  . Alcohol use No     Comment: Former Use  . Drug use: No  . Sexual activity: Not Currently   Other Topics Concern  . None   Social History Narrative  . None   Review of Systems - See HPI.  All other ROS are negative.  BP 122/86 (BP Location: Left Arm, Patient Position: Sitting, Cuff Size: Large)   Pulse 62   Temp 98.2 F (36.8 C) (Oral)   Resp 16   Ht 6' 1" (1.854 m)   Wt 240 lb 2 oz (108.9 kg)   SpO2 98%   BMI 31.68 kg/m   Physical Exam  Constitutional: He is oriented to person, place, and time and well-developed, well-nourished, and in no distress.  HENT:  Head: Normocephalic and atraumatic.  Eyes:  Conjunctivae are normal.  Neck: Neck supple.  Cardiovascular: Normal rate, regular rhythm, normal heart sounds and intact distal pulses.   Pulmonary/Chest: Effort normal and breath sounds normal. No respiratory distress. He has no wheezes. He has no rales. He exhibits no tenderness.  Musculoskeletal:       Feet:  Neurological: He is alert and oriented to person, place, and time.  Skin:     Psychiatric: Affect normal.  Vitals reviewed.  Recent Results (from the past 2160 hour(s))  Comprehensive metabolic panel     Status: None   Collection Time: 03/26/16 11:03 AM  Result Value Ref Range   Sodium 137 135 - 145 mEq/L   Potassium 3.9 3.5 - 5.1 mEq/L   Chloride 103 96 - 112 mEq/L   CO2 27 19 - 32 mEq/L   Glucose, Bld 96 70 - 99 mg/dL   BUN 17 6 - 23 mg/dL   Creatinine, Ser 1.27 0.40 - 1.50 mg/dL   Total Bilirubin 0.4 0.2 - 1.2 mg/dL   Alkaline Phosphatase 98 39 - 117 U/L   AST 19 0 - 37 U/L   ALT 24 0 - 53 U/L   Total Protein 7.8 6.0 - 8.3 g/dL   Albumin 4.3 3.5 - 5.2 g/dL   Calcium 9.8 8.4 - 10.5 mg/dL   GFR 76.94 >60.00 mL/min  Sed Rate (ESR)     Status: None   Collection Time: 06/12/16  9:31 AM  Result Value Ref Range   Sed Rate 6 0 - 20 mm/hr  CBC     Status: Abnormal   Collection Time: 06/12/16  9:31 AM  Result Value Ref Range   WBC 7.4 4.0 - 10.5 K/uL   RBC 4.55 4.22 - 5.81 Mil/uL   Platelets 216.0 150.0 - 400.0 K/uL   Hemoglobin 12.8 (L) 13.0 - 17.0 g/dL   HCT 37.8 (L) 39.0 - 52.0 %   MCV 83.1 78.0 - 100.0 fl   MCHC 33.7 30.0 - 36.0 g/dL   RDW 15.3 11.5 - 15.5 %  Wound culture     Status: None   Collection Time: 06/12/16  9:31 AM  Result Value Ref Range   Culture      Abundant STAPHYLOCOCCUS AUREUS Moderate GROUP B STREP (S.AGALACTIAE) ISOLATED    Organism ID, Bacteria STAPHYLOCOCCUS AUREUS     Comment: Rifampin and Gentamicin should not be used as single drugs for treatment of Staph infections.    Organism ID, Bacteria Moderate GROUP B STREP  (S.AGALACTIAE) ISOLATED     Comment: Beta hemolytic streptococci are predictably susceptible to penicillin and other beta-lactams. Susceptibility   testing not routinely performed.       Susceptibility   Staphylococcus aureus -  (no method available)    OXACILLIN 0.5 Sensitive     CEFAZOLIN  Sensitive     GENTAMICIN <=0.5 Sensitive     CIPROFLOXACIN <=0.5 Sensitive     LEVOFLOXACIN <=0.12 Sensitive     MOXIFLOXACIN <=0.25 Sensitive     TRIMETH/SULFA <=10 Sensitive     VANCOMYCIN 1 Sensitive     CLINDAMYCIN <=0.25 Sensitive     ERYTHROMYCIN 1 Intermediate     QUINUPRISTIN/DALF 0.5 Sensitive     RIFAMPIN <=0.5 Sensitive     TETRACYCLINE <=1 Sensitive     Assessment/Plan: 1. Skin mass X 2. R shoulder simple acrochordon about 1 cm in diameter with 5 mm base. Upper thoracic back -- seems more rounded. Will send for pathology to assess further.  - Dermatology pathology  Procedure performed as follows:  A discussion or risk and benefits of procedure discussed with patient. Procedure details reviewed with patient. Patient gave verbal agreement for procedure. Area was cleaned and prepped with betadine. Local anesthesia (lidocaine 1% with epinephrine)  applied to lesions x 2. Both lesions removed via shave with 11 blade scalpel. Minimal bleeding. Gauze applied until bleeding completely ceased. TAO applied. Bandages applied.  2. Rash and nonspecific skin eruption Improved markedly. Will complete entire prednisone taper. Continue supportive measures. FU if not completely resolving.  3. Wound of ankle, right, initial encounter Wound culture + for MSSA and strep. Sensitivities reviewed. Levaquin started. Patient to take as directed. Continue care. FU discussed.   ,  Cody, PA-C 

## 2016-06-18 ENCOUNTER — Telehealth: Payer: Self-pay | Admitting: Physician Assistant

## 2016-06-18 ENCOUNTER — Ambulatory Visit: Payer: PRIVATE HEALTH INSURANCE | Admitting: Physician Assistant

## 2016-06-18 ENCOUNTER — Encounter: Payer: Self-pay | Admitting: Physician Assistant

## 2016-06-18 ENCOUNTER — Ambulatory Visit (INDEPENDENT_AMBULATORY_CARE_PROVIDER_SITE_OTHER): Payer: PRIVATE HEALTH INSURANCE | Admitting: Physician Assistant

## 2016-06-18 ENCOUNTER — Other Ambulatory Visit: Payer: Self-pay | Admitting: Physician Assistant

## 2016-06-18 VITALS — BP 122/86 | HR 62 | Temp 98.2°F | Resp 16 | Ht 73.0 in | Wt 240.1 lb

## 2016-06-18 DIAGNOSIS — S91001A Unspecified open wound, right ankle, initial encounter: Secondary | ICD-10-CM

## 2016-06-18 DIAGNOSIS — R21 Rash and other nonspecific skin eruption: Secondary | ICD-10-CM

## 2016-06-18 DIAGNOSIS — R229 Localized swelling, mass and lump, unspecified: Secondary | ICD-10-CM

## 2016-06-18 MED ORDER — LEVOFLOXACIN 500 MG PO TABS: 500.0000 mg | ORAL_TABLET | Freq: Every day | ORAL | 0 refills | Status: DC

## 2016-06-18 NOTE — Progress Notes (Signed)
Pre visit review using our clinic review tool, if applicable. No additional management support is needed unless otherwise documented below in the visit note/SLS  

## 2016-06-18 NOTE — Patient Instructions (Signed)
I am glad the rash is resolving. Complete the entire course of the prednisone taper. Please keep the ankle clean and dry as previously directed. Take the antibiotic (Levaquin) as directed for 7 days. If you note area does not continue to heal, please follow-up in office.  I have sent the removed mass to the lab for assessment to verify benign nature. I will call with results. At the areas removed today, keep the skin clean and dry. You can remove bandages when you get home. If you note any redness, tenderness or drainage, please return to office as these are signs of infection.

## 2016-06-18 NOTE — Telephone Encounter (Signed)
Patient arrived late for OV, seen by provider/SLS 11/06

## 2016-06-18 NOTE — Telephone Encounter (Signed)
Patient did not show for his follow-up this morning. Please check on him. Wound culture did grow out abundant staph and group B strep.  Will start Levaquin 500 mg once daily for 7 days to treat both. He needs FU scheduled this week.

## 2016-06-27 ENCOUNTER — Encounter: Payer: Self-pay | Admitting: Neurology

## 2016-06-27 ENCOUNTER — Ambulatory Visit (INDEPENDENT_AMBULATORY_CARE_PROVIDER_SITE_OTHER): Payer: PRIVATE HEALTH INSURANCE | Admitting: Neurology

## 2016-06-27 VITALS — BP 124/80 | HR 69 | Ht 73.0 in | Wt 239.4 lb

## 2016-06-27 DIAGNOSIS — I6319 Cerebral infarction due to embolism of other precerebral artery: Secondary | ICD-10-CM | POA: Diagnosis not present

## 2016-06-27 DIAGNOSIS — F321 Major depressive disorder, single episode, moderate: Secondary | ICD-10-CM

## 2016-06-27 MED ORDER — FAMOTIDINE 40 MG PO TABS: 40.0000 mg | ORAL_TABLET | Freq: Every day | ORAL | 6 refills | Status: AC

## 2016-06-27 NOTE — Patient Instructions (Addendum)
1. Continue Plavix, control of blood pressure, cholesterol 2. Continue home PT exercises 3. Take famotidine 40mg  daily to help with stomach protection 4. Refer to Behavioral medicine for psychiatry and psychotherapy 5. Contact Dr. Evlyn CourierSood's office about the sleep apnea 6. Follow-up in 4 months, call for any changes 7. For any change in symptoms, go to ER immediately

## 2016-06-27 NOTE — Progress Notes (Signed)
NEUROLOGY FOLLOW UP OFFICE NOTE  Antonio Ellison [id number]  HISTORY OF PRESENT ILLNESS: I had the pleasure of seeing Antonio Ellison in follow-up in the neurology clinic on 06/27/2016. He is again accompanied by his mother who helps supplement the history today.  The patient was last seen 3 months ago for stroke with left-sided weakness. On his visit in August, he reported right-sided weakness that started when he was being discharged from the hospital. A repeat MRI brain with and without contrast was ordered, which did not show any evidence of new stroke. There were chronic microvascular ischemic changes in the white matter bilaterally, chronic left thalamic infarct unchanged from June 2017 scan, slight hyperintensity in the pons which may be related to chronic ischemia. He had a 30-day holter monitor which showed sinus rhythm, occasional asymptomatic bradycardia, no atrial fibrillation. He had a sleep study which showed OSA and PLMs. He has not been back to Dr. Halford Chessman. Since his last visit, he reports that his right side is much better after doing physical therapy. Their main concern today is the depression. He is always sleepy, tired, and depressed, with low energy. He reports being stressed last week. He lives with his wife. He reports getting hot this morning, sweaty, feeling like he would pass out. He got water and spilled it, sat down, then felt cold. He denies any falls but stumbles quite often. He has a constant dull ache and numbness in his right hand, but better than before. His right foot gets cold. He denies any headaches but is having a harder time with his new glasses. He denies any neck or back pain, no bowel/bladder dysfunction. He takes Plavix for secondary stroke prevention.  HPI 03/26/2016: This is a 50 yo RH man with a history of hypertension, hyperlipidemia, stroke at age 50 affecting his left side with minimal residual deficits (walking with a limp), who was admitted for a stroke at [facility] last 01/29/16. He presented for dizziness and stumbling for several days. He reported left arm tingling. He was evaluated by Neurology, exam showed 5/5 strength with some giveway weakness in the left leg. MRI brain reported low level increased signal on DWI in the right frontal periventricular white matter 31m, without appreciable increase or decrease in ADC value, this may represent a subacute deep white matter infarct. There was premature for age cerebral and cerebellar atrophy, subcortical periventricular white matter signal abnroamltiy favored to represent chronic microvascular ischemic change. There are an unusually large number of lacunar infarcts in the deep nuclei and periventricular white matter for patient's age, consistent with multiple chronic cerebrovascular insults. Compared to MRI in 2012, cerebrovascular disease has progressed. PT notes on HD 2 indicated 5/5 strength throughout, he was reporting burning/tingling in the left hand/arm and left leg and foot. He was On HD 3, he was evaluated by OT which indicated bilaterally UE MMT 4/5 grossly, symmetrical, within functional limits. Bilateral gross motor coordination mildly limited during ADL tasks. He was reporting numbness and tingling on the left side of his body. He was discharged the next day, and reports that as he was leaving, he noticed his right side had constricted movement, he could not lift his arm or open his hand, he could not lift his right leg to get into the car. It felt like ants were running through his right arm. He did not appear to report these symptoms at the hospital. Since hospital discharge, he has been going for PT for his right  side. He has gained some movement in his right UE and LE, and the paresthesias are now localized in the inner distal forearm. His left side feels fine. He recalls that a few days prior to his hospitalization, he felt like he was split down the middle, he had decreased sensation on his  right side, not feeling a cold soda in his right hand, no weakness at that time. As part of stroke workup, he had an MRA head and neck which were unremarkable. Echocardiogram showed mild concentric LVH, left atrium normal size, negative bubble study. Hypercoagulable workup was negative except for positive ANA with 1:80 titer, speckled pattern. ESR and CRP normal. HbA1c was 6.1, lipid panel showed total cholesterol 218, LDL 157. He was discharged home on Plavix and atorvastatin.   He has a history of stroke at age 50. He reports his right side was affected, however per hospital records, his wife reported left-sided symptoms. He mostly recovered from his except for a slight limp and memory changes. His mother reports both strokes have affected his thinking, changed his whole demeanor. 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 He worries a lot because he is unable to provide for his family. 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 He continues to have daytime drowsiness. He denies any falls but stumbles a lot. He uses his walker now to ambulate. He reports good sleep at night. He frequently complains of a headache, mostly on the frontal region radiating to the back, with some dizziness, no nausea/vomiting, no vision changes. He feels this was associated with his shoulder pains. His father and maternal grandmother had strokes.   PAST MEDICAL HISTORY: Past Medical History:  Diagnosis Date  . Asthma   . Chronic headaches   . History of chicken pox    51 Mths old  . Hyperlipidemia   . Hypertension   . Low testosterone   . OSA (obstructive sleep apnea) 06/06/2016  . Stroke [facility]) 2012    MEDICATIONS: Current Outpatient Prescriptions on File Prior to Visit  Medication Sig Dispense Refill  . amLODipine (NORVASC) 10 MG tablet Take 1 tablet (10 mg  total) by mouth daily. 30 tablet 5  . atorvastatin (LIPITOR) 80 MG tablet Take 1 tablet (80 mg total) by mouth daily. 30 tablet 5  . buPROPion (WELLBUTRIN SR) 150 MG 12 hr tablet Take 1 tablet (150 mg total) by mouth 2 (two) times daily. 180 tablet 1  . clopidogrel (PLAVIX) 75 MG tablet Take 75 mg by mouth daily.    . cyclobenzaprine (FLEXERIL) 5 MG tablet Take 5 mg by mouth 3 (three) times daily as needed for muscle spasms.    . fexofenadine (ALLEGRA) 180 MG tablet Take 180 mg by mouth daily as needed for allergies or rhinitis. Reported on 11/16/2015    . levofloxacin (LEVAQUIN) 500 MG tablet Take 1 tablet (500 mg total) by mouth daily. 7 tablet 0  . lisinopril (PRINIVIL,ZESTRIL) 10 MG tablet TAKE 1 TABLET (10 MG TOTAL) BY MOUTH DAILY. 30 tablet 3  . mupirocin ointment (BACTROBAN) 2 % Place 1 application into the nose 2 (two) times daily. 22 g 0  . predniSONE (DELTASONE) 10 MG tablet Take 40 mg (4 tablets) daily for 3 days, then 3 tablets for 3 days, then 2 tablets x 3 days then 1 tablet x 3 days. 30 tablet  0  . sertraline (ZOLOFT) 50 MG tablet Take 1 tablet (50 mg total) by mouth daily. 90 tablet 1   No current facility-administered medications on file prior to visit.     ALLERGIES: No Known Allergies  FAMILY HISTORY: Family History  Problem Relation Age of Onset  . Hypertension Father   . Stroke Father     Late 60s  . Diabetes Father   . Sinusitis Mother   . Kidney disease Maternal Aunt   . Hypertension Maternal Aunt   . Liver cancer Maternal Aunt   . Hypertension Paternal Grandmother   . Aneurysm Paternal Grandmother   . Hypertension Maternal Grandfather   . Alzheimer's disease Maternal Grandmother   . Heart disease Maternal Grandmother   . Hypertension Paternal Grandfather   . Stroke Paternal Grandfather   . Healthy Sister   . Healthy Daughter   . Healthy Son     SOCIAL HISTORY: Social History   Social History  . Marital status: Married    Spouse name: N/A  . Number  of children: N/A  . Years of education: N/A   Occupational History  . disabled/unemployed    Social History Main Topics  . Smoking status: Never Smoker  . Smokeless tobacco: Never Used  . Alcohol use No     Comment: Former Use  . Drug use: No  . Sexual activity: Not Currently   Other Topics Concern  . Not on file   Social History Narrative  . No narrative on file    REVIEW OF SYSTEMS: Constitutional: No fevers, chills, or sweats, + generalized fatigue, change in appetite Eyes: No visual changes, double vision, eye pain Ear, nose and throat: No hearing loss, ear pain, nasal congestion, sore throat Cardiovascular: No chest pain, palpitations Respiratory:  No shortness of breath at rest or with exertion, wheezes GastrointestinaI: No nausea, vomiting, diarrhea, abdominal pain, fecal incontinence Genitourinary:  No dysuria, urinary retention or frequency Musculoskeletal:  No neck pain, back pain Integumentary: No rash, pruritus, skin lesions Neurological: as above Psychiatric: + depression, insomnia, anxiety Endocrine: No palpitations, fatigue, diaphoresis, mood swings, change in appetite, change in weight, increased thirst Hematologic/Lymphatic:  No anemia, purpura, petechiae. Allergic/Immunologic: no itchy/runny eyes, nasal congestion, recent allergic reactions, rashes  PHYSICAL EXAM: Vitals:   06/27/16 1053  BP: 124/80  Pulse: 69   General: No acute distress, poor eye contact, flat affect Head:  Normocephalic/atraumatic Neck: supple, no paraspinal tenderness, full range of motion Heart:  Regular rate and rhythm Lungs:  Clear to auscultation bilaterally Back: No paraspinal tenderness Skin/Extremities: No rash, no edema Neurological Exam: alert and oriented to person, place, and time. No aphasia or dysarthria. Fund of knowledge is appropriate.  Recent and remote memory are intact.  Attention and concentration are normal.    Able to name objects and repeat phrases.  Cranial nerves: Pupils equal, round, reactive to light. Extraocular movements intact with no nystagmus. Visual fields full. Facial sensation intact. No facial asymmetry. Tongue, uvula, palate midline.  Motor: Bulk and tone normal, muscle strength 5/5 on left UE and LE, 4/5 on right UE and LE (some giveway weakness).  Sensation to light touch intact.  No extinction to double simultaneous stimulation.  Deep tendon reflexes 2+ throughout, toes downgoing.  Finger to nose testing intact.  Gait slow and cautious, no ataxia, Romberg negative.  IMPRESSION: This is a 50 yo RH man with  hypertension, hyperlipidemia, stroke at age 54 affecting his left side with minimal residual deficits (walking with a limp),  who was admitted for a stroke at [facility] last 01/29/16 for stroke. At that time, his main symptom was dizziness and left-sided paresthesias. MRI brain had shown possible right frontal subacute deep white matter infarct. There was premature for age cerebral and cerebellar atrophy, subcortical periventricular white matter signal abnroamltiy favored to represent chronic microvascular ischemic change. There are an unusually large number of lacunar infarcts in the deep nuclei and periventricular white matter for patient's age, consistent with multiple chronic cerebrovascular insults. He reported right-sided weakness that apparently started on hospital discharge, repeat MRI brain did not show any evidence of new stroke. There may be some functional weakness, he did undergo PT with improvement in right-sided weakness. His main concern today is depression, his mother endorses a significant impact on his quality of life. He is now interested in seeing Behavioral medicine, referral will be sent. Continue control of vascular risk factors, Plavix for secondary stroke prevention. He is asking for medication for GI protection, he will start famotidine 54m daily. We also discussed results of sleep study, he  reports he has not heard from their office and will contact them. He will follow-up in 4 months and knows to go to the ER if symptoms change or worsen.   Thank you for allowing me to participate in his care.  Please do not hesitate to call for any questions or concerns.  The duration of this appointment visit was 25 minutes of face-to-face time with the patient.  Greater than 50% of this time was spent in counseling, explanation of diagnosis, planning of further management, and coordination of care.   KEllouise Newer M.D.   CC: WRaiford Noble PA-C, Dr. SHalford Chessman

## 2016-06-27 NOTE — Telephone Encounter (Signed)
Pt calling back [phone]

## 2016-06-27 NOTE — Telephone Encounter (Signed)
atc pt X2, no answer, mailbox full.  Wcb.

## 2016-07-02 ENCOUNTER — Telehealth: Payer: Self-pay | Admitting: Pulmonary Disease

## 2016-07-02 DIAGNOSIS — G4733 Obstructive sleep apnea (adult) (pediatric): Secondary | ICD-10-CM

## 2016-07-02 NOTE — Telephone Encounter (Signed)
Coralyn HellingVineet Sood, MD      9:21 AM  Note    PSG 05/21/16 >> AHI 9.1, SpO2 low 87%, REM AHI 74.1, Supine AHI 43.2, PLMI 18.59   Will have my nurse inform pt that sleep study shows mild sleep apnea.  Options are 1) CPAP now, 2) ROV first.  If He is agreeable to CPAP, then please send order for auto CPAP range 5 to 15 cm H2O with heated humidity and mask of choice.  Have download sent 1 month after starting CPAP and set up ROV 2 months after starting CPAP.  ROV can be with me or NP.      Pt wanted to set up CPAP and then follow up. Order placed and patient has appt on 09/06/16 at 11:15am with VS. Nothing more needed at this time.

## 2016-07-03 ENCOUNTER — Other Ambulatory Visit: Payer: Self-pay | Admitting: Physician Assistant

## 2016-07-03 DIAGNOSIS — S91001A Unspecified open wound, right ankle, initial encounter: Secondary | ICD-10-CM

## 2016-07-11 ENCOUNTER — Encounter: Payer: Self-pay | Admitting: Neurology

## 2016-07-20 NOTE — Telephone Encounter (Signed)
ATC, mailbox full Letter mailed Will close message.

## 2016-07-31 ENCOUNTER — Other Ambulatory Visit: Payer: Self-pay | Admitting: Physician Assistant

## 2016-07-31 DIAGNOSIS — F32A Depression, unspecified: Secondary | ICD-10-CM

## 2016-07-31 DIAGNOSIS — F329 Major depressive disorder, single episode, unspecified: Secondary | ICD-10-CM

## 2016-08-28 ENCOUNTER — Other Ambulatory Visit: Payer: Self-pay | Admitting: Physician Assistant

## 2016-08-28 DIAGNOSIS — F32A Depression, unspecified: Secondary | ICD-10-CM

## 2016-08-28 DIAGNOSIS — F329 Major depressive disorder, single episode, unspecified: Secondary | ICD-10-CM

## 2016-08-31 NOTE — Telephone Encounter (Signed)
LMOVM advising patient he needs an appointment for more refills of medication.

## 2016-09-03 ENCOUNTER — Encounter: Payer: Self-pay | Admitting: Emergency Medicine

## 2016-09-03 NOTE — Telephone Encounter (Signed)
Attempt to call the mobile number and home number and both voicemails were full or unable to leave message. Sending a letter to contact office to schedule an appointment

## 2016-09-06 ENCOUNTER — Ambulatory Visit: Payer: Self-pay | Admitting: Pulmonary Disease

## 2016-09-11 ENCOUNTER — Telehealth: Payer: Self-pay | Admitting: Physician Assistant

## 2016-09-11 NOTE — Telephone Encounter (Signed)
I've tried to call both numbers on pt chart, I am unable to message. No VM set up.

## 2016-09-11 NOTE — Telephone Encounter (Signed)
Patient requested refills. 30 days given with instruction he needed follow-up.  Please call patient to make sure he is scheduled for appointment. This is very important.

## 2016-09-11 NOTE — Telephone Encounter (Signed)
Let us try a few more times. If no success will mail a letter.

## 2016-09-12 ENCOUNTER — Encounter: Payer: Self-pay | Admitting: Physician Assistant

## 2016-09-12 NOTE — Telephone Encounter (Signed)
Letter signed. Please mail.

## 2016-09-12 NOTE — Telephone Encounter (Signed)
Unable to reach pt and still no VM avail. Printed letter for you to sign to mail to pt.

## 2016-09-12 NOTE — Telephone Encounter (Signed)
Letter has been mailed.

## 2016-10-13 ENCOUNTER — Other Ambulatory Visit: Payer: Self-pay | Admitting: Physician Assistant

## 2016-10-13 DIAGNOSIS — F329 Major depressive disorder, single episode, unspecified: Secondary | ICD-10-CM

## 2016-10-13 DIAGNOSIS — F32A Depression, unspecified: Secondary | ICD-10-CM

## 2016-10-15 ENCOUNTER — Encounter: Payer: Self-pay | Admitting: Emergency Medicine

## 2016-10-15 ENCOUNTER — Telehealth: Payer: Self-pay | Admitting: Physician Assistant

## 2016-10-15 DIAGNOSIS — F329 Major depressive disorder, single episode, unspecified: Secondary | ICD-10-CM

## 2016-10-15 DIAGNOSIS — F32A Depression, unspecified: Secondary | ICD-10-CM

## 2016-10-15 MED ORDER — BUPROPION HCL ER (SR) 150 MG PO TB12: 150.0000 mg | ORAL_TABLET | Freq: Two times a day (BID) | ORAL | 0 refills | Status: DC

## 2016-10-15 MED ORDER — AMLODIPINE BESYLATE 10 MG PO TABS: 10.0000 mg | ORAL_TABLET | Freq: Every day | ORAL | 0 refills | Status: DC

## 2016-10-15 MED ORDER — SERTRALINE HCL 50 MG PO TABS: 50.0000 mg | ORAL_TABLET | Freq: Every day | ORAL | 0 refills | Status: DC

## 2016-10-15 MED ORDER — ATORVASTATIN CALCIUM 80 MG PO TABS: 80.0000 mg | ORAL_TABLET | Freq: Every day | ORAL | 0 refills | Status: DC

## 2016-10-15 NOTE — Telephone Encounter (Signed)
Pt would like to switch providers from Antonio Ellison  to Dr. Carmelia RollerWendling.   Pt says that he spoke with Selena Battenody in regards to a med refill and was advised to get a new provider and then he will be able to refill.        Pt's CB: [phone]

## 2016-10-15 NOTE — Telephone Encounter (Signed)
Ok to transfer.  I will send in a 15 day supply until he can be seen.  He was told over a month ago at last fill he needed a follow-up before further refills.  15-day supply of requested medications sent to pharmacy.

## 2016-10-15 NOTE — Telephone Encounter (Signed)
OK 

## 2016-10-17 NOTE — Telephone Encounter (Signed)
Called pt to schedule office visit. No answer. Pt doesn't have his vm set up to leave a message.

## 2016-10-18 ENCOUNTER — Encounter: Payer: Self-pay | Admitting: Neurology

## 2016-10-18 ENCOUNTER — Ambulatory Visit (INDEPENDENT_AMBULATORY_CARE_PROVIDER_SITE_OTHER): Payer: PRIVATE HEALTH INSURANCE | Admitting: Neurology

## 2016-10-18 ENCOUNTER — Other Ambulatory Visit: Payer: Self-pay | Admitting: Emergency Medicine

## 2016-10-18 VITALS — BP 120/80 | HR 68 | Ht 72.0 in | Wt 240.0 lb

## 2016-10-18 DIAGNOSIS — G4733 Obstructive sleep apnea (adult) (pediatric): Secondary | ICD-10-CM | POA: Diagnosis not present

## 2016-10-18 DIAGNOSIS — F321 Major depressive disorder, single episode, moderate: Secondary | ICD-10-CM | POA: Diagnosis not present

## 2016-10-18 DIAGNOSIS — I6319 Cerebral infarction due to embolism of other precerebral artery: Secondary | ICD-10-CM

## 2016-10-18 MED ORDER — LISINOPRIL 10 MG PO TABS: 10.0000 mg | ORAL_TABLET | Freq: Every day | ORAL | 0 refills | Status: DC

## 2016-10-18 NOTE — Telephone Encounter (Addendum)
°  Relation to ZO:XWRUpt:self Call back number:[phone] Pharmacy: [id number] - [place], [place] - [address] [facility] [address] [phone] (Fax)     Reason for call:  Patient scheduled new patient appointment for 10/24/16 with Dr. Carmelia RollerWendling, patient requested 15 day supply of lisinopril (PRINIVIL,ZESTRIL) 10 MG tablet, please advise

## 2016-10-18 NOTE — Progress Notes (Signed)
NEUROLOGY FOLLOW UP OFFICE NOTE  Antonio Ellison [id number]  HISTORY OF PRESENT ILLNESS: I had the pleasure of seeing Antonio Ellison in follow-up in the neurology clinic on 10/18/2016. He is again accompanied by his mother who helps supplement the history today.  The patient was last seen 4 months ago for stroke with left-sided weakness and significant post-stroke depression. He had right-sided weakness as he was being discharged from the hospital, repeat MRI brain did not show any evidence of new stroke. There were chronic microvascular ischemic changes in the white matter bilaterally, chronic left thalamic infarct unchanged from June 2017 scan, slight hyperintensity in the pons which may be related to chronic ischemia. He had a 30-day holter monitor which showed sinus rhythm, occasional asymptomatic bradycardia, no atrial fibrillation. He had a sleep study which showed OSA and PLMs. He had some issues with his phone and had been unable to scheduled CPAP and Pulm follow-up yet. He had significant post-stroke depression and has a lot of anger about his situation. He does not have a lot of energy, he has difficulty remembering things if he does not write them down, needing reminders. He would like a medication that does not cause drowsiness. He states that he sleeps all day, and when he is awake ("which is not too often"), he has headaches. He denies any falls but stumbles. His back bothers him and he feels a little "woozy." The fingers on his right hand still tingle but the weakness is better, he has some right shoulder pain. He takes Plavix for secondary stroke prevention.  HPI 03/26/2016: This is a 51 yo RH man with a history of hypertension, hyperlipidemia, stroke at age 70 affecting his left side with minimal residual deficits (walking with a limp), who was admitted for a stroke at [facility] last 01/29/16. He presented for dizziness and stumbling for several days. He reported left arm  tingling. He was evaluated by Neurology, exam showed 5/5 strength with some giveway weakness in the left leg. MRI brain reported low level increased signal on DWI in the right frontal periventricular white matter 89m, without appreciable increase or decrease in ADC value, this may represent a subacute deep white matter infarct. There was premature for age cerebral and cerebellar atrophy, subcortical periventricular white matter signal abnroamltiy favored to represent chronic microvascular ischemic change. There are an unusually large number of lacunar infarcts in the deep nuclei and periventricular white matter for patient's age, consistent with multiple chronic cerebrovascular insults. Compared to MRI in 2012, cerebrovascular disease has progressed. PT notes on HD 2 indicated 5/5 strength throughout, he was reporting burning/tingling in the left hand/arm and left leg and foot. He was On HD 3, he was evaluated by OT which indicated bilaterally UE MMT 4/5 grossly, symmetrical, within functional limits. Bilateral gross motor coordination mildly limited during ADL tasks. He was reporting numbness and tingling on the left side of his body. He was discharged the next day, and reports that as he was leaving, he noticed his right side had constricted movement, he could not lift his arm or open his hand, he could not lift his right leg to get into the car. It felt like ants were running through his right arm. He did not appear to report these symptoms at the hospital. Since hospital discharge, he has been going for PT for his right side. He has gained some movement in his right UE and LE, and the paresthesias are now localized in the inner distal  forearm. His left side feels fine. He recalls that a few days prior to his hospitalization, he felt like he was split down the middle, he had decreased sensation on his right side, not feeling a cold soda in his right hand, no weakness at that time. As part of stroke workup, he had  an MRA head and neck which were unremarkable. Echocardiogram showed mild concentric LVH, left atrium normal size, negative bubble study. Hypercoagulable workup was negative except for positive ANA with 1:80 titer, speckled pattern. ESR and CRP normal. HbA1c was 6.1, lipid panel showed total cholesterol 218, LDL 157. He was discharged home on Plavix and atorvastatin.   He has a history of stroke at age 71. He reports his right side was affected, however per hospital records, his wife reported left-sided symptoms. He mostly recovered from his except for a slight limp and memory changes. His mother reports both strokes have affected his thinking, changed his whole demeanor. 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 He worries a lot because he is unable to provide for his family. 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 He continues to have daytime drowsiness. He denies any falls but stumbles a lot. He uses his walker now to ambulate. He reports good sleep at night. He frequently complains of a headache, mostly on the frontal region radiating to the back, with some dizziness, no nausea/vomiting, no vision changes. He feels this was associated with his shoulder pains. His father and maternal grandmother had strokes.   PAST MEDICAL HISTORY: Past Medical History:  Diagnosis Date  . Asthma   . Chronic headaches   . History of chicken pox    31 Mths old  . Hyperlipidemia   . Hypertension   . Low testosterone   . OSA (obstructive sleep apnea) 06/06/2016  . Stroke [facility]) 2012    MEDICATIONS: Current Outpatient Prescriptions on File Prior to Visit  Medication Sig Dispense Refill  . amLODipine (NORVASC) 10 MG tablet Take 1 tablet (10 mg total) by mouth daily. 15 tablet 0  . atorvastatin (LIPITOR) 80 MG tablet Take 1 tablet (80 mg total) by mouth  daily. 15 tablet 0  . buPROPion (WELLBUTRIN SR) 150 MG 12 hr tablet Take 1 tablet (150 mg total) by mouth 2 (two) times daily. 30 tablet 0  . clopidogrel (PLAVIX) 75 MG tablet Take 75 mg by mouth daily.    . cyclobenzaprine (FLEXERIL) 5 MG tablet Take 5 mg by mouth 3 (three) times daily as needed for muscle spasms.    . famotidine (PEPCID) 40 MG tablet Take 1 tablet (40 mg total) by mouth daily. 30 tablet 6  . fexofenadine (ALLEGRA) 180 MG tablet Take 180 mg by mouth daily as needed for allergies or rhinitis. Reported on 11/16/2015    . levofloxacin (LEVAQUIN) 500 MG tablet Take 1 tablet (500 mg total) by mouth daily. 7 tablet 0  . mupirocin ointment (BACTROBAN) 2 % Place 1 application into the nose 2 (two) times daily. 22 g 0  . predniSONE (DELTASONE) 10 MG tablet Take 40 mg (4 tablets) daily for 3 days, then 3 tablets for 3 days, then 2 tablets x 3 days then 1 tablet x 3 days. 30 tablet 0  . sertraline (ZOLOFT) 50 MG tablet Take 1 tablet (50 mg total) by mouth daily. 15 tablet 0  No current facility-administered medications on file prior to visit.     ALLERGIES: No Known Allergies  FAMILY HISTORY: Family History  Problem Relation Age of Onset  . Hypertension Father   . Stroke Father     Late 57s  . Diabetes Father   . Sinusitis Mother   . Kidney disease Maternal Aunt   . Hypertension Maternal Aunt   . Liver cancer Maternal Aunt   . Hypertension Paternal Grandmother   . Aneurysm Paternal Grandmother   . Hypertension Maternal Grandfather   . Alzheimer's disease Maternal Grandmother   . Heart disease Maternal Grandmother   . Hypertension Paternal Grandfather   . Stroke Paternal Grandfather   . Healthy Sister   . Healthy Daughter   . Healthy Son     SOCIAL HISTORY: Social History   Social History  . Marital status: Married    Spouse name: N/A  . Number of children: N/A  . Years of education: N/A   Occupational History  . disabled/unemployed    Social History Main  Topics  . Smoking status: Never Smoker  . Smokeless tobacco: Never Used  . Alcohol use No     Comment: Former Use  . Drug use: No  . Sexual activity: Not Currently   Other Topics Concern  . Not on file   Social History Narrative  . No narrative on file    REVIEW OF SYSTEMS: Constitutional: No fevers, chills, or sweats, + generalized fatigue, change in appetite Eyes: No visual changes, double vision, eye pain Ear, nose and throat: No hearing loss, ear pain, nasal congestion, sore throat Cardiovascular: No chest pain, palpitations Respiratory:  No shortness of breath at rest or with exertion, wheezes GastrointestinaI: No nausea, vomiting, diarrhea, abdominal pain, fecal incontinence Genitourinary:  No dysuria, urinary retention or frequency Musculoskeletal:  No neck pain, back pain Integumentary: No rash, pruritus, skin lesions Neurological: as above Psychiatric: + depression, insomnia, anxiety Endocrine: No palpitations, fatigue, diaphoresis, mood swings, change in appetite, change in weight, increased thirst Hematologic/Lymphatic:  No anemia, purpura, petechiae. Allergic/Immunologic: no itchy/runny eyes, nasal congestion, recent allergic reactions, rashes  PHYSICAL EXAM: Vitals:   10/18/16 1023  BP: 120/80  Pulse: 68   General: No acute distress, poor eye contact, flat affect Head:  Normocephalic/atraumatic Neck: supple, no paraspinal tenderness, full range of motion Heart:  Regular rate and rhythm Lungs:  Clear to auscultation bilaterally Back: No paraspinal tenderness Skin/Extremities: No rash, no edema Neurological Exam: alert and oriented to person, place, and time. No aphasia or dysarthria. Fund of knowledge is appropriate.  Recent and remote memory are intact.  Attention and concentration are normal.    Able to name objects and repeat phrases. Cranial nerves: Pupils equal, round, reactive to light. Extraocular movements intact with no nystagmus. Visual fields full.  Facial sensation intact. No facial asymmetry. Tongue, uvula, palate midline.  Motor: Bulk and tone normal, muscle strength 5/5 on left UE and LE, 4/5 on right UE and LE (some giveway weakness, but orbits around the right).  Sensation to light touch intact.  No extinction to double simultaneous stimulation.  Deep tendon reflexes 2+ throughout, toes downgoing.  Finger to nose testing intact.  Gait slow and cautious, no ataxia, Romberg negative.  IMPRESSION: This is a 51 yo RH man with  hypertension, hyperlipidemia, stroke at age 74 affecting his left side with minimal residual deficits, who was admitted for a stroke at [facility] last 01/29/16 for stroke. At that time, his main symptom  was dizziness and left-sided paresthesias. MRI brain had shown possible right frontal subacute deep white matter infarct. There was premature for age cerebral and cerebellar atrophy, subcortical periventricular white matter signal abnormality favored to represent chronic microvascular ischemic change. There are an unusually large number of lacunar infarcts in the deep nuclei and periventricular white matter for patient's age, consistent with multiple chronic cerebrovascular insults. He reported right-sided weakness that apparently started on hospital discharge, repeat MRI brain did not show any evidence of new stroke. There may be some functional weakness, he did undergo PT with improvement in right-sided weakness. Continue Plavix and control of vascular risk factors for secondary stroke prevention. His main concern today continues to be post-stroke depression, he was unable to see Behavioral Health due to phone issues. This is causing a significant impact on his quality of life, he would like to proceed with psychiatry and therapy referral. He was also diagnosed with sleep apnea and will follow-up on CPAP fitting. He will follow-up in 1 year and knows to go to the ER if symptoms change or worsen.   Thank you for  allowing me to participate in his care.  Please do not hesitate to call for any questions or concerns.  The duration of this appointment visit was 15 minutes of face-to-face time with the patient.  Greater than 50% of this time was spent in counseling, explanation of diagnosis, planning of further management, and coordination of care.   Ellouise Newer, M.D.   CC: Raiford Noble, PA-C

## 2016-10-18 NOTE — Patient Instructions (Addendum)
1. Proceed with Behavioral Medicine visit with Psychiatry and therapy. [phone]. 2. Contact Pulmonary for CPAP. [phone]. 3. Continue all your medications 4. Follow-up in 1 year, call for any changes

## 2016-10-18 NOTE — Telephone Encounter (Signed)
Rx sent to the preferred patient pharmacy quantity #15 Tried to call patient to notify but voicemail is not set up.

## 2016-10-24 ENCOUNTER — Ambulatory Visit (INDEPENDENT_AMBULATORY_CARE_PROVIDER_SITE_OTHER): Payer: PRIVATE HEALTH INSURANCE | Admitting: Family Medicine

## 2016-10-24 ENCOUNTER — Encounter: Payer: Self-pay | Admitting: Neurology

## 2016-10-24 ENCOUNTER — Other Ambulatory Visit: Payer: Self-pay | Admitting: Family Medicine

## 2016-10-24 ENCOUNTER — Encounter: Payer: Self-pay | Admitting: Family Medicine

## 2016-10-24 VITALS — BP 130/88 | HR 68 | Temp 98.2°F | Resp 16 | Ht 72.0 in | Wt 240.6 lb

## 2016-10-24 DIAGNOSIS — L309 Dermatitis, unspecified: Secondary | ICD-10-CM

## 2016-10-24 DIAGNOSIS — E349 Endocrine disorder, unspecified: Secondary | ICD-10-CM | POA: Diagnosis not present

## 2016-10-24 DIAGNOSIS — F32A Depression, unspecified: Secondary | ICD-10-CM

## 2016-10-24 DIAGNOSIS — I10 Essential (primary) hypertension: Secondary | ICD-10-CM | POA: Diagnosis not present

## 2016-10-24 DIAGNOSIS — F329 Major depressive disorder, single episode, unspecified: Secondary | ICD-10-CM

## 2016-10-24 LAB — TESTOSTERONE: Testosterone: 288.4 ng/dL — ABNORMAL LOW (ref 300.00–890.00)

## 2016-10-24 LAB — LUTEINIZING HORMONE: LH: 5.9 m[IU]/mL (ref 1.50–9.30)

## 2016-10-24 LAB — FOLLICLE STIMULATING HORMONE: FSH: 15.7 m[IU]/mL (ref 1.4–18.1)

## 2016-10-24 MED ORDER — BUPROPION HCL ER (SR) 150 MG PO TB12: 150.0000 mg | ORAL_TABLET | Freq: Two times a day (BID) | ORAL | 5 refills | Status: AC

## 2016-10-24 MED ORDER — ATORVASTATIN CALCIUM 80 MG PO TABS: 80.0000 mg | ORAL_TABLET | Freq: Every day | ORAL | 5 refills | Status: DC

## 2016-10-24 MED ORDER — LISINOPRIL 10 MG PO TABS: 10.0000 mg | ORAL_TABLET | Freq: Every day | ORAL | 5 refills | Status: DC

## 2016-10-24 MED ORDER — AMLODIPINE BESYLATE 10 MG PO TABS: 10.0000 mg | ORAL_TABLET | Freq: Every day | ORAL | 5 refills | Status: AC

## 2016-10-24 MED ORDER — SERTRALINE HCL 100 MG PO TABS: 100.0000 mg | ORAL_TABLET | Freq: Every day | ORAL | 2 refills | Status: DC

## 2016-10-24 MED ORDER — HYDROCORTISONE 2.5 % EX CREA: TOPICAL_CREAM | Freq: Two times a day (BID) | CUTANEOUS | 0 refills | Status: AC

## 2016-10-24 MED ORDER — CLOPIDOGREL BISULFATE 75 MG PO TABS: 75.0000 mg | ORAL_TABLET | Freq: Every day | ORAL | 5 refills | Status: DC

## 2016-10-24 NOTE — Patient Instructions (Addendum)
Call your sleep specialist [facility]) to get fitted for your CPAP. I think this will help with your daytime sleepiness.   Use Vaseline twice daily.

## 2016-10-24 NOTE — Progress Notes (Signed)
Pre visit review using our clinic review tool, if applicable. No additional management support is needed unless otherwise documented below in the visit note. 

## 2016-10-24 NOTE — Progress Notes (Signed)
Chief Complaint  Patient presents with  . Transitions Of Care    Subjective: Patient is a 51 y.o. male here for transition of care. Here with his mother to help with the hx.  Depression Patient has a history of depression on Wellbutrin and Zoloft. 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  He has followed with the counts in the past, however did not feel he received any benefit from it. He is set to see a psychiatrist on May 2 of this year. In addition to depressed mood currently is very concerned about his daytime somnolence. He also has sleep apnea and did not show up for CPAP titration. No SI or HI, no self medication.  Hypertension Patient presents for hypertension follow up. He does not monitor home blood pressures. He is compliant with medications. Patient has these side effects of medication: none He is sometimes adhering to a healthy diet overall. Exercise: no routine exercise  Skin Vision is a rash on his right ankle and right upper chest, previously treated with Levaquin and mupirocin. He states it itches, it is actually improving in size and not spreading. No drainage or redness.   ROS: Heart: Denies chest pain  Lungs: Denies SOB   Family History  Problem Relation Age of Onset  . Hypertension Father   . Stroke Father     Late 66s  . Diabetes Father   . Sinusitis Mother   . Kidney disease Maternal Aunt   . Hypertension Maternal Aunt   . Liver cancer Maternal Aunt   . Hypertension Paternal Grandmother   . Aneurysm Paternal Grandmother   . Hypertension Maternal Grandfather   . Alzheimer's disease Maternal Grandmother   . Heart disease Maternal Grandmother   . Hypertension Paternal Grandfather   . Stroke Paternal Grandfather   . Healthy Sister   . Healthy Daughter   . Healthy Son    Past Medical History:  Diagnosis Date  . Asthma   . Chronic headaches   . Depression    . History of chicken pox    78 Mths old  . Hyperlipidemia   . Hypertension   . Low testosterone   . OSA (obstructive sleep apnea) 06/06/2016  . Stroke [facility]) 2012   No Known Allergies  Current Outpatient Prescriptions:  .  amLODipine (NORVASC) 10 MG tablet, Take 1 tablet (10 mg total) by mouth daily., Disp: 30 tablet, Rfl: 5 .  atorvastatin (LIPITOR) 80 MG tablet, Take 1 tablet (80 mg total) by mouth daily., Disp: 30 tablet, Rfl: 5 .  buPROPion (WELLBUTRIN SR) 150 MG 12 hr tablet, Take 1 tablet (150 mg total) by mouth 2 (two) times daily., Disp: 60 tablet, Rfl: 5 .  clopidogrel (PLAVIX) 75 MG tablet, Take 1 tablet (75 mg total) by mouth daily., Disp: 30 tablet, Rfl: 5 .  famotidine (PEPCID) 40 MG tablet, Take 1 tablet (40 mg total) by mouth daily., Disp: 30 tablet, Rfl: 6 .  fexofenadine (ALLEGRA) 180 MG tablet, Take 180 mg by mouth daily as needed for allergies or rhinitis. Reported on 11/16/2015, Disp: , Rfl:  .  lisinopril (PRINIVIL,ZESTRIL) 10 MG tablet, Take 1 tablet (10 mg total) by mouth daily., Disp: 30 tablet, Rfl: 5 .  mupirocin ointment (BACTROBAN) 2 %, Place 1 application into the nose 2 (two) times daily., Disp: 22 g, Rfl: 0 .  hydrocortisone 2.5 % cream, Apply topically 2 (two) times daily., Disp: 30 g,  Rfl: 0 .  sertraline (ZOLOFT) 100 MG tablet, Take 1 tablet (100 mg total) by mouth daily., Disp: 30 tablet, Rfl: 2  Objective: BP 130/88 (BP Location: Left Arm, Patient Position: Sitting, Cuff Size: Large)   Pulse 68   Temp 98.2 F (36.8 C) (Oral)   Resp 16   Ht 6' (1.829 m)   Wt 240 lb 9.6 oz (109.1 kg)   SpO2 98%   BMI 32.63 kg/m  General: Awake, appears stated age HEENT: MMM, Mallampati IV, EOMi Heart: RRR, no murmurs, no bruits, no LE edema Lungs: CTAB, no rales, wheezes or rhonchi. No accessory muscle use Skin: Patch of hyperpigmentation, hyperkeratinization noted on R upper chest wall, also on R medial ankle, no drainage, scaling, fluctuance or TTP. Neuro:  Struggled with word finding throughout visit Psych: Age appropriate judgment and insight, normal affect and mood   Assessment and Plan: Depression, unspecified depression type - Plan: buPROPion (WELLBUTRIN SR) 150 MG 12 hr tablet, sertraline (ZOLOFT) 100 MG tablet  Essential hypertension, benign - Plan: amLODipine (NORVASC) 10 MG tablet, lisinopril (PRINIVIL,ZESTRIL) 10 MG tablet  Eczema, unspecified type - Plan: hydrocortisone 2.5 % cream  Testosterone deficiency - Plan: Testosterone, free, Testosterone, FSH, LH  Orders as above. Increase Zoloft from 50 mg to 100 mg daily. Counseled on diet and exercise. Continue HTN regimen. Topical steroid cream, Vaseline twice daily. Will see again and if no better, will biopsy. Hx of T def, will recheck. F/u in 12 days.  The patient and his mother voiced understanding and agreement to the plan.  Jilda Rocheicholas Paul PhoeniciaWendling, DO 10/24/16  10:51 AM

## 2016-10-29 LAB — TESTOSTERONE TOTAL,FREE,BIO, MALES
Albumin: 4.6 g/dL (ref 3.6–5.1)
Sex Hormone Binding: 39 nmol/L (ref 10–50)
Testosterone, Bioavailable: 110.3 ng/dL (ref 110.0–575.0)
Testosterone, Free: 52.5 pg/mL (ref 46.0–224.0)
Testosterone: 457 ng/dL (ref 250–827)

## 2016-10-29 NOTE — Progress Notes (Signed)
I spoke with [facility] rep, when ordering Testosterone Free it is just a Calculation so they another Test code, Testosterone Total, Free Bioavailable Immunoassay...KMP

## 2016-11-01 ENCOUNTER — Encounter: Payer: Self-pay | Admitting: *Deleted

## 2016-11-01 ENCOUNTER — Ambulatory Visit: Payer: PRIVATE HEALTH INSURANCE | Admitting: Pulmonary Disease

## 2016-12-12 ENCOUNTER — Ambulatory Visit (INDEPENDENT_AMBULATORY_CARE_PROVIDER_SITE_OTHER): Payer: PRIVATE HEALTH INSURANCE | Admitting: Psychiatry

## 2016-12-12 DIAGNOSIS — F0280 Dementia in other diseases classified elsewhere without behavioral disturbance: Secondary | ICD-10-CM

## 2017-01-14 ENCOUNTER — Encounter: Payer: Self-pay | Admitting: Family Medicine

## 2017-01-14 ENCOUNTER — Ambulatory Visit (INDEPENDENT_AMBULATORY_CARE_PROVIDER_SITE_OTHER): Payer: PRIVATE HEALTH INSURANCE | Admitting: Family Medicine

## 2017-01-14 VITALS — BP 120/70 | HR 75 | Temp 97.8°F | Ht 72.0 in | Wt 237.6 lb

## 2017-01-14 DIAGNOSIS — R5383 Other fatigue: Secondary | ICD-10-CM

## 2017-01-14 DIAGNOSIS — R21 Rash and other nonspecific skin eruption: Secondary | ICD-10-CM | POA: Diagnosis not present

## 2017-01-14 NOTE — Patient Instructions (Addendum)
Give us 2-3 business days to get the results of your labs back.   Based on my exam today, I am not convinced that the MRI will change much with our plan. If you have a concern, I would contact your neurologist.   Aim to do some physical exertion for 150 minutes per week. This is typically divided into 5 days per week, 30 minutes per day. The activity should be enough to get your heart rate up. Anything is better than nothing if you have time constraints.

## 2017-01-14 NOTE — Progress Notes (Signed)
Chief Complaint  Patient presents with  . Pt requesting MRI    pt stated that he feels he may have had another Stroke-moving and speaking slower-x 2 weeks    Subjective: Patient is a 51 y.o. male here for fatigue.  The patient has a history of a CVA for which he follows neurology for. Over the past 2 weeks, he has been having increased fatigue talking slower and falling asleep a lot. His wife thinks he may have had another stroke and is requesting an MRI. He does not believe he has had a stroke. He has not reached out to his neurology team for whom he sees for having a stroke. He is not having any slurred speech, difficulty swallowing, vision changes, or new numbness/tingling/weakness (unrelated to prior stroke).  Has area on R leg and chest that were tx'd by hydrocortisone 2.5% when I saw him last. It helped, but area on chest continues to bother him and will sometimes hurt. He is not worried about the leg. It is not changing color or shape. No other changes to report.   ROS: Neuro: As noted in HPI Skin: As noted in HPI   Family History  Problem Relation Age of Onset  . Hypertension Father   . Stroke Father        Late 45s  . Diabetes Father   . Sinusitis Mother   . Kidney disease Maternal Aunt   . Hypertension Maternal Aunt   . Liver cancer Maternal Aunt   . Hypertension Paternal Grandmother   . Aneurysm Paternal Grandmother   . Hypertension Maternal Grandfather   . Alzheimer's disease Maternal Grandmother   . Heart disease Maternal Grandmother   . Hypertension Paternal Grandfather   . Stroke Paternal Grandfather   . Healthy Sister   . Healthy Daughter   . Healthy Son    Past Medical History:  Diagnosis Date  . Asthma   . Chronic headaches   . Depression   . History of chicken pox    5 Mths old  . Hyperlipidemia   . Hypertension   . Low testosterone   . OSA (obstructive sleep apnea) 06/06/2016  . Stroke [facility]) 2012   No Known Allergies  Current Outpatient  Prescriptions:  .  amLODipine (NORVASC) 10 MG tablet, Take 1 tablet (10 mg total) by mouth daily., Disp: 30 tablet, Rfl: 5 .  atorvastatin (LIPITOR) 80 MG tablet, Take 1 tablet (80 mg total) by mouth daily., Disp: 30 tablet, Rfl: 5 .  buPROPion (WELLBUTRIN SR) 150 MG 12 hr tablet, Take 1 tablet (150 mg total) by mouth 2 (two) times daily., Disp: 60 tablet, Rfl: 5 .  clopidogrel (PLAVIX) 75 MG tablet, Take 1 tablet (75 mg total) by mouth daily., Disp: 30 tablet, Rfl: 5 .  famotidine (PEPCID) 40 MG tablet, Take 1 tablet (40 mg total) by mouth daily., Disp: 30 tablet, Rfl: 6 .  fexofenadine (ALLEGRA) 180 MG tablet, Take 180 mg by mouth daily as needed for allergies or rhinitis. Reported on 11/16/2015, Disp: , Rfl:  .  hydrocortisone 2.5 % cream, Apply topically 2 (two) times daily., Disp: 30 g, Rfl: 0 .  lisinopril (PRINIVIL,ZESTRIL) 10 MG tablet, Take 1 tablet (10 mg total) by mouth daily., Disp: 30 tablet, Rfl: 5 .  mupirocin ointment (BACTROBAN) 2 %, Place 1 application into the nose 2 (two) times daily., Disp: 22 g, Rfl: 0 .  sertraline (ZOLOFT) 100 MG tablet, Take 1 tablet (100 mg total) by mouth daily., Disp: 30  tablet, Rfl: 2  Objective: BP 120/70 (BP Location: Left Arm, Patient Position: Sitting, Cuff Size: Normal)   Pulse 75   Temp 97.8 F (36.6 C) (Oral)   Ht 6' (1.829 m)   Wt 237 lb 9.6 oz (107.8 kg)   SpO2 99%   BMI 32.22 kg/m  General: Awake, appears stated age HEENT: MMM, EOMi, PERRLA Heart: RRR, no murmurs Lungs: CTAB, no rales, wheezes or rhonchi. No accessory muscle use MSK: 5/5 strength throughout with exception of 4/5 R elbow flexion, R hip flexion, R knee flexion/extension Neuro: No cerebellar signs, CN2-12 intact, no facial droop, fluent speech, patellar reflex and biceps reflex 2/4 wo clonus, calcaneal reflex 1/4 b/l, no clonus.  Psych: Age appropriate judgment and insight, normal affect and mood  Assessment and Plan: Fatigue, unspecified type - Plan: CBC, TSH, T4,  free, Comprehensive metabolic panel, Vitamin D (25 hydroxy)  Rash  Orders as above. I discussed with the patient that I do not think an MRI is indicated at this time. He is not having a significant sequelae where we would change our management (SLP, PT, etc). If he feels very concerned that he did have a recent stroke, which neither of us believe he did, then he should contact his neurologist. I will check some labs in the meantime for his fatigue. He follows up with his sleep specialist soon. For his skin, we will have him return for a punch biopsy at his earliest convenience. The patient voiced understanding and agreement to the plan.  Jilda Rocheicholas Paul KingslandWendling, DO 01/14/17  5:20 PM

## 2017-01-15 LAB — CBC
HEMATOCRIT: 40.8 % (ref 39.0–52.0)
Hemoglobin: 13.6 g/dL (ref 13.0–17.0)
MCHC: 33.3 g/dL (ref 30.0–36.0)
MCV: 84.4 fl (ref 78.0–100.0)
PLATELETS: 185 10*3/uL (ref 150.0–400.0)
RBC: 4.83 Mil/uL (ref 4.22–5.81)
RDW: 15.2 % (ref 11.5–15.5)
WBC: 5.7 10*3/uL (ref 4.0–10.5)

## 2017-01-15 LAB — COMPREHENSIVE METABOLIC PANEL
ALK PHOS: 98 U/L (ref 39–117)
ALT: 27 U/L (ref 0–53)
AST: 20 U/L (ref 0–37)
Albumin: 4.4 g/dL (ref 3.5–5.2)
BUN: 15 mg/dL (ref 6–23)
CO2: 27 meq/L (ref 19–32)
CREATININE: 1.28 mg/dL (ref 0.40–1.50)
Calcium: 9.8 mg/dL (ref 8.4–10.5)
Chloride: 103 mEq/L (ref 96–112)
GFR: 76.01 mL/min (ref >60.00)
GLUCOSE: 83 mg/dL (ref 70–99)
Potassium: 3.8 mEq/L (ref 3.5–5.1)
Sodium: 138 mEq/L (ref 135–145)
TOTAL PROTEIN: 7.7 g/dL (ref 6.0–8.3)
Total Bilirubin: 0.4 mg/dL (ref 0.2–1.2)

## 2017-01-15 LAB — T4, FREE: FREE T4: 0.83 ng/dL (ref 0.60–1.60)

## 2017-01-15 LAB — TSH: TSH: 1.06 u[IU]/mL (ref 0.35–4.50)

## 2017-01-15 LAB — VITAMIN D 25 HYDROXY (VIT D DEFICIENCY, FRACTURES): VITD: 29.71 ng/mL — ABNORMAL LOW (ref 30.00–100.00)

## 2017-01-16 ENCOUNTER — Ambulatory Visit (INDEPENDENT_AMBULATORY_CARE_PROVIDER_SITE_OTHER): Payer: PRIVATE HEALTH INSURANCE | Admitting: Psychiatry

## 2017-01-16 DIAGNOSIS — F0280 Dementia in other diseases classified elsewhere without behavioral disturbance: Secondary | ICD-10-CM | POA: Diagnosis not present

## 2017-01-21 ENCOUNTER — Telehealth: Payer: Self-pay | Admitting: Family Medicine

## 2017-01-21 DIAGNOSIS — R7989 Other specified abnormal findings of blood chemistry: Secondary | ICD-10-CM

## 2017-01-21 NOTE — Telephone Encounter (Signed)
Called and spoke with the pt's mother and informed her of the pt's recent lab results and note.  She verbalized understanding and agreed.  She stated that they will call back to schedule the lab appt for the Vit D in 3 mos.  She will also make sure the pt keeps the appt with the sleep specialist.  Future lab ordered and sent.//AB/CMA

## 2017-01-21 NOTE — Telephone Encounter (Signed)
Pt returned call for lab results. Wendling.

## 2017-01-21 NOTE — Telephone Encounter (Signed)
-----   Message from Sharlene DoryNicholas Paul Wendling, DO sent at 01/16/2017  7:07 AM EDT ----- Mild Vit D insufficiency. Other labs unremarkable.  AB- let pt know his Vit D is slightly low, take Vit D3 800 IU daily, we can recheck in 3 mo, please place order and schedule. Reinforce that he should not miss his appt with his sleep specialist. TY.

## 2017-01-30 ENCOUNTER — Ambulatory Visit (INDEPENDENT_AMBULATORY_CARE_PROVIDER_SITE_OTHER): Payer: PRIVATE HEALTH INSURANCE | Admitting: Pulmonary Disease

## 2017-01-30 ENCOUNTER — Encounter: Payer: Self-pay | Admitting: Pulmonary Disease

## 2017-01-30 ENCOUNTER — Other Ambulatory Visit: Payer: Self-pay | Admitting: Family Medicine

## 2017-01-30 VITALS — BP 118/70 | HR 65 | Ht 72.0 in | Wt 236.6 lb

## 2017-01-30 DIAGNOSIS — G4733 Obstructive sleep apnea (adult) (pediatric): Secondary | ICD-10-CM

## 2017-01-30 DIAGNOSIS — G4719 Other hypersomnia: Secondary | ICD-10-CM | POA: Diagnosis not present

## 2017-01-30 DIAGNOSIS — F329 Major depressive disorder, single episode, unspecified: Secondary | ICD-10-CM

## 2017-01-30 DIAGNOSIS — Z72821 Inadequate sleep hygiene: Secondary | ICD-10-CM | POA: Diagnosis not present

## 2017-01-30 DIAGNOSIS — F32A Depression, unspecified: Secondary | ICD-10-CM

## 2017-01-30 NOTE — Progress Notes (Signed)
Current Outpatient Prescriptions on File Prior to Visit  Medication Sig  . amLODipine (NORVASC) 10 MG tablet Take 1 tablet (10 mg total) by mouth daily.  Marland Kitchen atorvastatin (LIPITOR) 80 MG tablet Take 1 tablet (80 mg total) by mouth daily.  Marland Kitchen buPROPion (WELLBUTRIN SR) 150 MG 12 hr tablet Take 1 tablet (150 mg total) by mouth 2 (two) times daily.  . clopidogrel (PLAVIX) 75 MG tablet Take 1 tablet (75 mg total) by mouth daily.  . famotidine (PEPCID) 40 MG tablet Take 1 tablet (40 mg total) by mouth daily.  . fexofenadine (ALLEGRA) 180 MG tablet Take 180 mg by mouth daily as needed for allergies or rhinitis. Reported on 11/16/2015  . hydrocortisone 2.5 % cream Apply topically 2 (two) times daily.  Marland Kitchen lisinopril (PRINIVIL,ZESTRIL) 10 MG tablet Take 1 tablet (10 mg total) by mouth daily.  . mupirocin ointment (BACTROBAN) 2 % Place 1 application into the nose 2 (two) times daily.  . sertraline (ZOLOFT) 100 MG tablet Take 1 tablet (100 mg total) by mouth daily.   No current facility-administered medications on file prior to visit.      Chief Complaint  Patient presents with  . Follow-up    Follow up for CPAP patient has had his CPAP for about 2 months states that he is sleeping to much with it  he is sleeping 6 to 8 hours at night some times he lays down during the day with the CPAP  on a sleeps another 3 hours      Sleep tests PSG 05/31/16 >> AHI 9.1, SpO2 low 87%, REM AHI 74.1, Supine AHI 43.2, PLMI 18 Auto CPAP 12/30/16 to 01/28/17 >> used on 30 of 30 nights with average 5 hrs 11 min.  Average AHI 2.8 with median CPAP 7 and 95 th percentile CPAP 9 cm H2O  Past medical history Asthma, Headaches, Depression, HTN, HLD, Low T, CVA 2012  Past surgical history, Family history, Social history, Allergies all reviewed.  Vital Signs BP 118/70 (BP Location: Right Arm, Cuff Size: Normal)   Pulse 65   Ht 6' (1.829 m)   Wt 236 lb 9.6 oz (107.3 kg)   SpO2 97%   BMI 32.09 kg/m   History of Present  Illness Antonio Ellison is a 50 y.o. male with obstructive sleep apnea.  Since his last visit he had sleep study.  Showed mild sleep apnea, but severe in REM and supine sleep.  He has been started on CPAP.  No issue with mask fit. He thinks he is using CPAP more than 5 hrs per night.  He feels CPAP helps, but he is still sleepy during the day.  He will takes naps during the day, but doesn't use CPAP.  He does not have a consistent sleep or wake time.  Physical Exam  General - No distress ENT - No sinus tenderness, no oral exudate, no LAN, MP 4, enlarged tongue Cardiac - s1s2 regular, no murmur Chest - No wheeze/rales/dullness Back - No focal tenderness Abd - Soft, non-tender Ext - No edema Neuro - Normal strength Skin - No rashes Psych - normal mood, and behavior   Assessment/Plan  Obstructive sleep apnea. - he is compliant and reports benefit - advised him to use for entire time he is asleep - continue auto CPAP  Poor sleep hygiene. - emphasized importance of regular sleep-wake schedule - reviewed proper sleep hygiene  Excessive daytime sleepiness. - if his symptoms persist, then he might need in lab titration study  followed by [facility]   Patient Instructions  Use CPAP for entire time you are asleep  Try to keep a regular sleep-wake schedule  Call if your daytime sleepiness doesn't improve  Follow up in 6 months  Time spent 26 minutes  Coralyn HellingVineet Jacee Enerson, MD [facility] Pager:  [phone] 01/30/2017, 3:11 PM

## 2017-01-30 NOTE — Patient Instructions (Signed)
Use CPAP for entire time you are asleep  Try to keep a regular sleep-wake schedule  Call if your daytime sleepiness doesn't improve  Follow up in 6 months

## 2017-03-11 ENCOUNTER — Telehealth: Payer: Self-pay | Admitting: Family Medicine

## 2017-03-11 NOTE — Telephone Encounter (Signed)
Caller name: Minerva Areolaric  Relation to pt: self  Call back number: [phone] Pharmacy: [id number] - [place], [place] - [address]  Reason for call: Pt came in office and brought in a copy of his insurance card [facility]([employer]icaid [place] [employer]) so we can have on file and also stated that he is needing a refill on all his meds. Please advise.

## 2017-03-11 NOTE — Telephone Encounter (Signed)
Confirmed all Rx's requested are ready for P/U at the pharmacy/[facility] stating this to patient as well as need for appointment in September/thx dmf, RMA/AB/CMA

## 2017-03-11 NOTE — Telephone Encounter (Signed)
Called @ 1:29pm @ [phone]) asking to RTC regarding message below regarding med refill.//AB/CMA

## 2017-04-01 ENCOUNTER — Telehealth: Payer: Self-pay | Admitting: *Deleted

## 2017-04-01 NOTE — Telephone Encounter (Signed)
Received request for Medical records from [employer], forwarded to [place] for email/scan/SLS 08/20     

## 2017-05-10 ENCOUNTER — Other Ambulatory Visit: Payer: Self-pay | Admitting: Family Medicine

## 2017-05-10 DIAGNOSIS — F329 Major depressive disorder, single episode, unspecified: Secondary | ICD-10-CM

## 2017-05-10 DIAGNOSIS — F32A Depression, unspecified: Secondary | ICD-10-CM

## 2017-05-11 ENCOUNTER — Other Ambulatory Visit: Payer: Self-pay | Admitting: Family Medicine

## 2017-05-11 DIAGNOSIS — I10 Essential (primary) hypertension: Secondary | ICD-10-CM

## 2017-09-16 IMAGING — MR MR HEAD WO/W CM
10 of 12 series · 38 of 48 positions shown · IV contrast (MULTIHANCE)
Comparison: MRI head 01/29/2016

CLINICAL DATA: CVA

EXAM:
MRI HEAD WITHOUT AND WITH CONTRAST
TECHNIQUE: Multiplanar, multiecho pulse sequences of the brain and surrounding
structures were obtained without and with intravenous contrast.
CONTRAST:  20 mL MultiHance IV

[Series 2: T1 · sagittal · 5.0mm · 0.47mm/px · 2 of 25 slices shown]
[im 1/25]
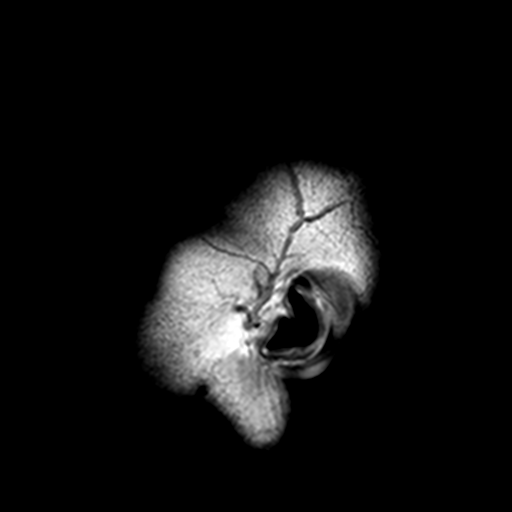
[im 25/25]
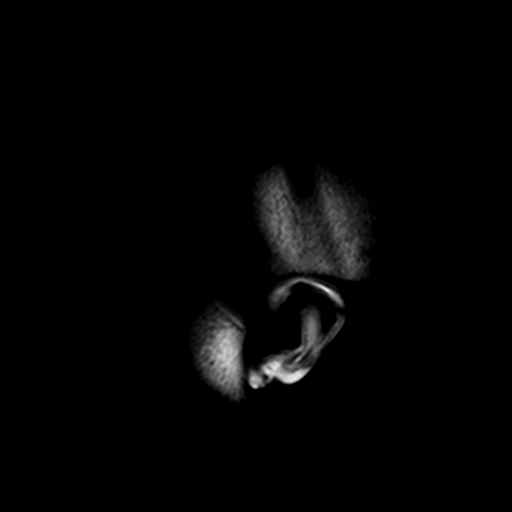

[Series 3: DWI · axial · 3.0mm · 2.19mm/px · z∈[-70,+92]mm · 8 of 100 slices shown (1 of 4)]
[im 1/100]
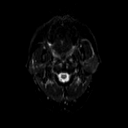
[im 15/100]
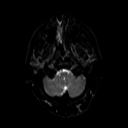
[im 29/100]
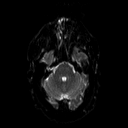
[im 43/100]
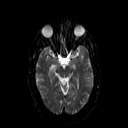
[im 57/100]
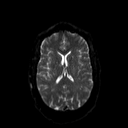
[im 71/100]
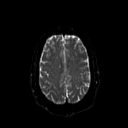
[im 85/100]
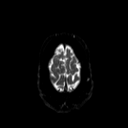
[im 100/100]
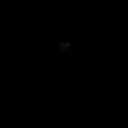

[Series 4: DWI · axial · 3.0mm · 2.19mm/px · z∈[-70,+92]mm · 4 of 50 slices shown (2 of 4)]
[im 1/50]
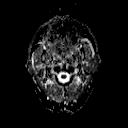
[im 17/50]
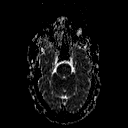
[im 33/50]
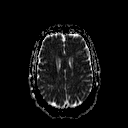
[im 50/50]
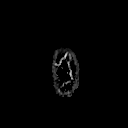

[Series 5: T2 · axial · 5.0mm · 0.47mm/px · z∈[-71,+103]mm · 2 of 26 slices shown (1 of 2)]
[im 1/26]
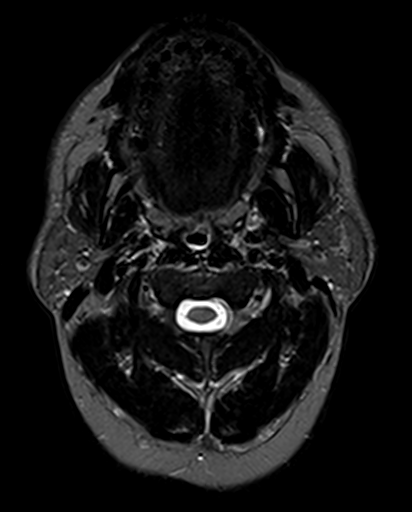
[im 26/26]
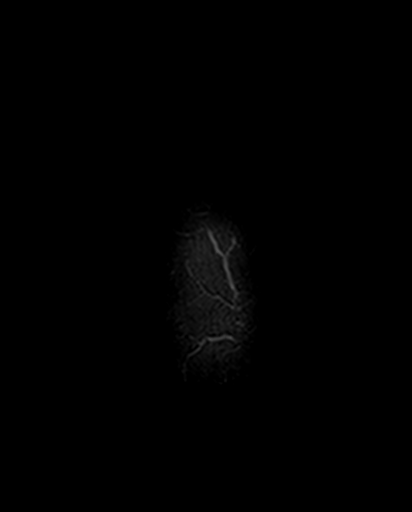

[Series 6: T2 · axial · 5.0mm · 0.47mm/px · z∈[-72,+103]mm · 2 of 26 slices shown (2 of 2)]
[im 1/26]
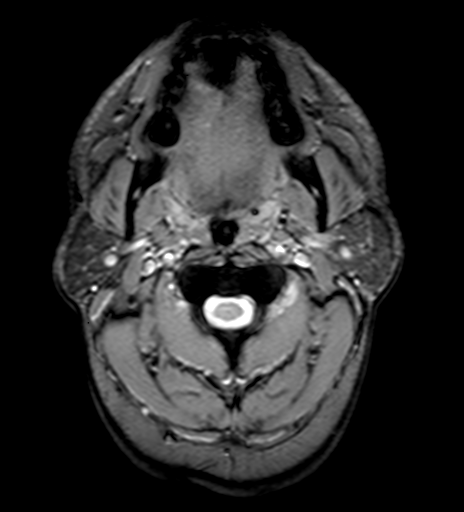
[im 26/26]
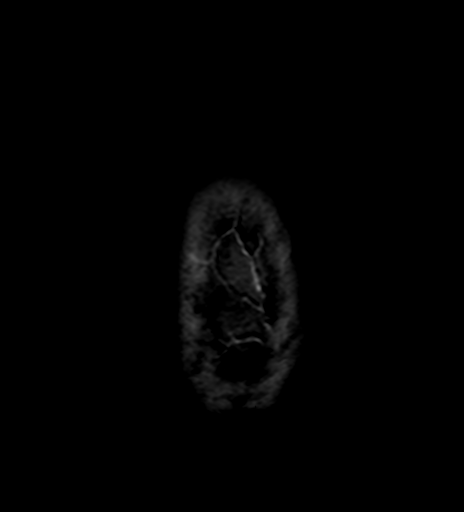

[Series 7: FLAIR · axial · 5.0mm · 0.47mm/px · z∈[-72,+103]mm · 2 of 26 slices shown]
[im 1/26]
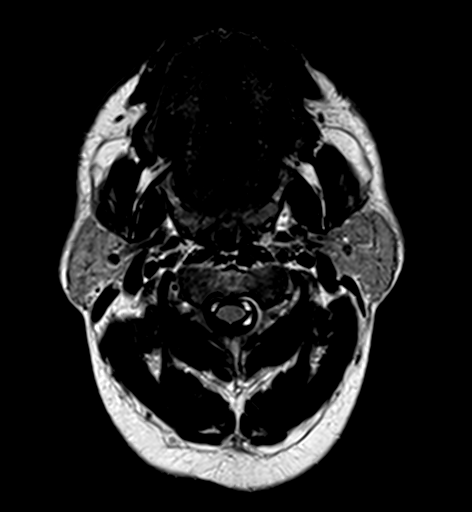
[im 26/26]
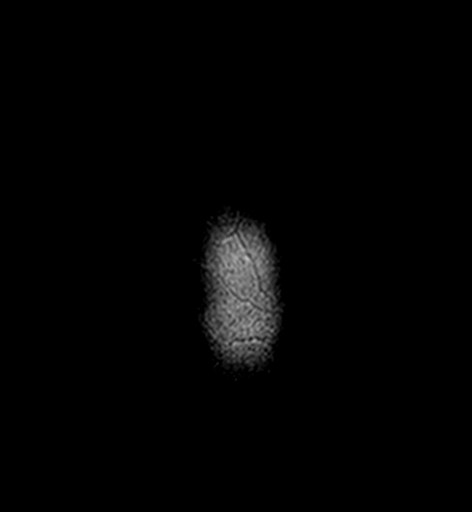

[Series 9: DWI · coronal · 3.0mm · 1.46mm/px · 8 of 100 slices shown (3 of 4)]
[im 1/100]
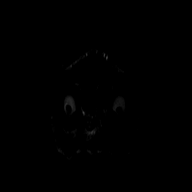
[im 15/100]
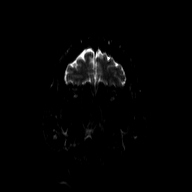
[im 29/100]
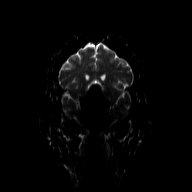
[im 43/100]
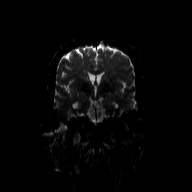
[im 57/100]
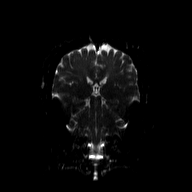
[im 71/100]
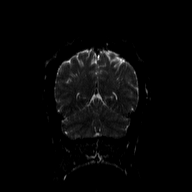
[im 85/100]
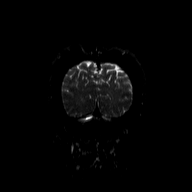
[im 100/100]
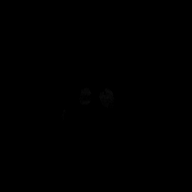

[Series 10: DWI · coronal · 3.0mm · 1.46mm/px · 4 of 50 slices shown (4 of 4)]
[im 1/50]
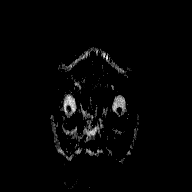
[im 17/50]
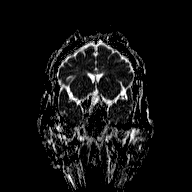
[im 33/50]
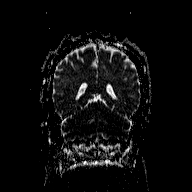
[im 50/50]
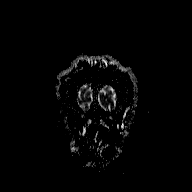

[Series 11: T2 post-contrast · coronal · 5.0mm · 0.45mm/px · 3 of 31 slices shown]
[im 1/31]
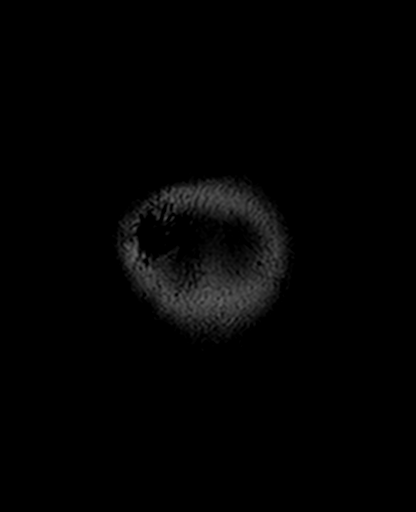
[im 16/31]
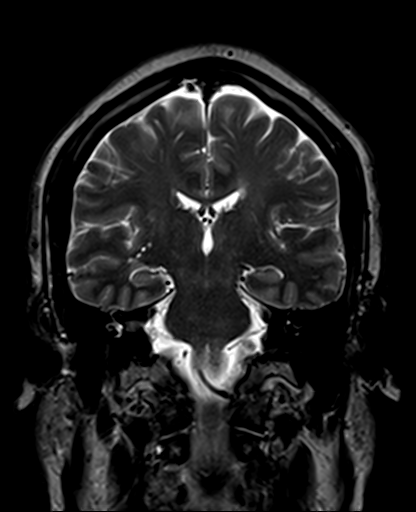
[im 31/31]
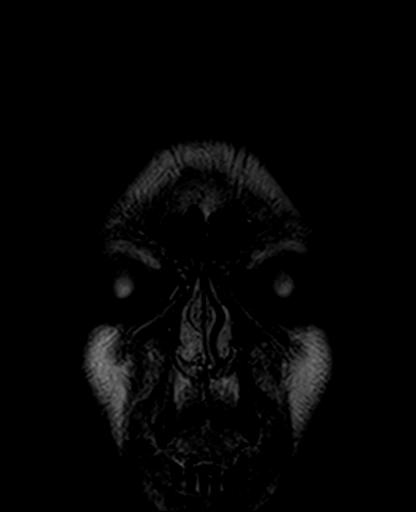

[Series 13: T1 post-contrast · coronal · 5.0mm · 0.45mm/px · 3 of 31 slices shown]
[im 1/31]
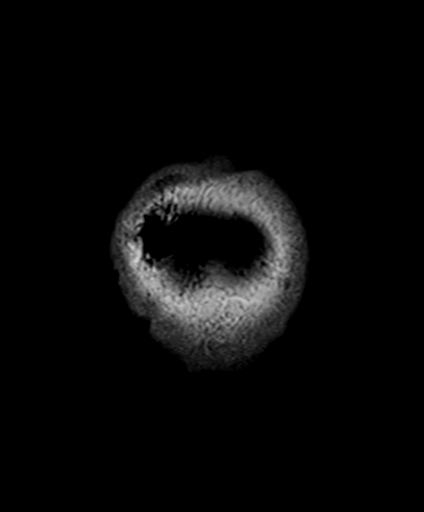
[im 16/31]
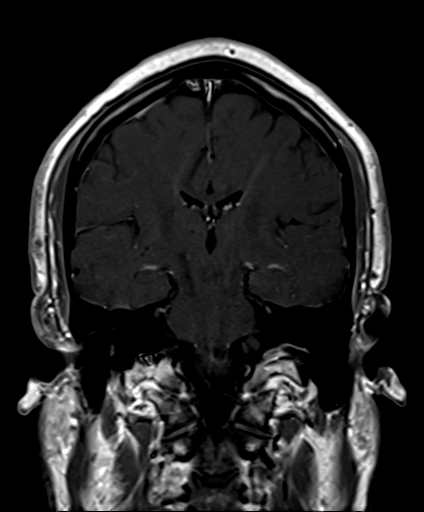
[im 31/31]
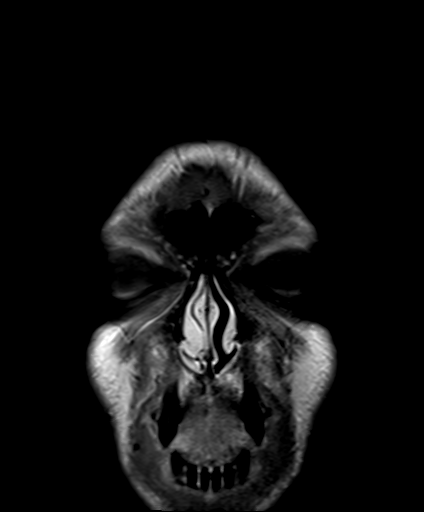

[38 of 48 positions shown; findings below may reference images not displayed]

FINDINGS: Negative for acute infarct. Chronic microvascular ischemic changes
in the white matter bilaterally. Chronic infarct left thalamus
unchanged. Slight hyperintensity in the pons which may be related to
chronic ischemia. No cortically based infarct

Negative for hemorrhage or fluid collection.

Negative for mass or edema.

Postcontrast imaging demonstrates normal enhancement. No enhancing
mass lesion. Normal dural venous sinus enhancement

Ventricle size normal.  Cerebral volume normal.

Orbital structures normal. Normal pituitary. Slight mucosal edema in
the paranasal sinuses.
IMPRESSION: No acute abnormality.  Chronic microvascular ischemic changes.

## 2017-10-23 ENCOUNTER — Ambulatory Visit: Payer: Self-pay | Admitting: Neurology

## 2018-01-07 DIAGNOSIS — F0280 Dementia in other diseases classified elsewhere without behavioral disturbance: Secondary | ICD-10-CM

## 2018-01-17 ENCOUNTER — Telehealth: Payer: Self-pay | Admitting: *Deleted

## 2018-01-17 NOTE — Telephone Encounter (Signed)
Received request for Medical Records from [employer]; forwarded to [place] for email/scan/SLS 06/07

## 2020-12-21 ENCOUNTER — Telehealth: Payer: Self-pay

## 2020-12-21 NOTE — Telephone Encounter (Signed)
The patient was last seen on 01/14/2017 Called to inform the patient of this. He then stated he Dr. Carmelia Roller did not sound familiar to him then he remembered he was being seen by Dr. Regino Schultze at [facility] in [facility]. He would call his current PCP for refills He confirmed Dr. Carmelia Roller no longer his PCP.  Will remove name from this chart.

## 2020-12-21 NOTE — Telephone Encounter (Signed)
Caller states that he has a few medications that he needs to get refilled.  Telephone: [phone]
